# Patient Record
Sex: Male | Born: 1965
Health system: Southern US, Community
[De-identification: ages and names within clinical notes are randomized; demographics above are authoritative.]

## PROBLEM LIST (undated history)

## (undated) DIAGNOSIS — B1920 Unspecified viral hepatitis C without hepatic coma: Secondary | ICD-10-CM

## (undated) DIAGNOSIS — K219 Gastro-esophageal reflux disease without esophagitis: Secondary | ICD-10-CM

## (undated) DIAGNOSIS — A64 Unspecified sexually transmitted disease: Secondary | ICD-10-CM

## (undated) DIAGNOSIS — E785 Hyperlipidemia, unspecified: Secondary | ICD-10-CM

## (undated) DIAGNOSIS — T7840XA Allergy, unspecified, initial encounter: Secondary | ICD-10-CM

## (undated) DIAGNOSIS — H9319 Tinnitus, unspecified ear: Secondary | ICD-10-CM

## (undated) DIAGNOSIS — L409 Psoriasis, unspecified: Secondary | ICD-10-CM

## (undated) HISTORY — DX: Psoriasis, unspecified: L40.9

## (undated) HISTORY — PX: INGUINAL HERNIA REPAIR: SUR1180

## (undated) HISTORY — DX: Unspecified sexually transmitted disease: A64

## (undated) HISTORY — DX: Allergy, unspecified, initial encounter: T78.40XA

## (undated) HISTORY — DX: Hyperlipidemia, unspecified: E78.5

## (undated) HISTORY — PX: JOINT REPLACEMENT: SHX530

## (undated) HISTORY — PX: COLONOSCOPY: SHX174

## (undated) HISTORY — DX: Tinnitus, unspecified ear: H93.19

## (undated) HISTORY — DX: Gastro-esophageal reflux disease without esophagitis: K21.9

---

## 2000-09-24 ENCOUNTER — Emergency Department (HOSPITAL_COMMUNITY): Admission: EM | Admit: 2000-09-24 | Discharge: 2000-09-24 | Payer: Self-pay | Admitting: Emergency Medicine

## 2001-03-02 ENCOUNTER — Emergency Department (HOSPITAL_COMMUNITY): Admission: EM | Admit: 2001-03-02 | Discharge: 2001-03-02 | Payer: Self-pay | Admitting: Emergency Medicine

## 2006-05-12 ENCOUNTER — Emergency Department (HOSPITAL_COMMUNITY): Admission: EM | Admit: 2006-05-12 | Discharge: 2006-05-12 | Payer: Self-pay | Admitting: Emergency Medicine

## 2007-02-16 ENCOUNTER — Emergency Department (HOSPITAL_COMMUNITY): Admission: EM | Admit: 2007-02-16 | Discharge: 2007-02-16 | Payer: Self-pay | Admitting: Emergency Medicine

## 2008-01-07 ENCOUNTER — Ambulatory Visit: Payer: Self-pay | Admitting: Family Medicine

## 2008-01-07 ENCOUNTER — Encounter (INDEPENDENT_AMBULATORY_CARE_PROVIDER_SITE_OTHER): Payer: Self-pay | Admitting: Internal Medicine

## 2008-01-07 LAB — CONVERTED CEMR LAB
ALT: 30 units/L (ref 0–53)
AST: 24 units/L (ref 0–37)
BUN: 22 mg/dL (ref 6–23)
Calcium: 8.8 mg/dL (ref 8.4–10.5)
Chloride: 104 meq/L (ref 96–112)
Creatinine, Ser: 0.85 mg/dL (ref 0.40–1.50)
HCV Ab: REACTIVE — AB
HDL: 54 mg/dL (ref >39)
Hep A Total Ab: NEGATIVE
Hep B Core Total Ab: NEGATIVE
Hep B E Ab: NEGATIVE
Hep B S Ab: NEGATIVE
Hepatitis B Surface Ag: NEGATIVE
Total Bilirubin: 0.5 mg/dL (ref 0.3–1.2)
Total CHOL/HDL Ratio: 2.6
VLDL: 11 mg/dL (ref 0–40)

## 2008-01-08 ENCOUNTER — Ambulatory Visit: Payer: Self-pay | Admitting: *Deleted

## 2008-02-02 ENCOUNTER — Ambulatory Visit: Payer: Self-pay | Admitting: Internal Medicine

## 2008-02-02 LAB — CONVERTED CEMR LAB: HCV Quantitative: 2980000 intl units/mL — ABNORMAL HIGH (ref <43)

## 2009-10-17 ENCOUNTER — Ambulatory Visit: Payer: Self-pay | Admitting: Internal Medicine

## 2009-10-17 ENCOUNTER — Encounter (INDEPENDENT_AMBULATORY_CARE_PROVIDER_SITE_OTHER): Payer: Self-pay | Admitting: Adult Health

## 2009-10-17 LAB — CONVERTED CEMR LAB
Albumin: 4.4 g/dL (ref 3.5–5.2)
BUN: 8 mg/dL (ref 6–23)
CO2: 23 meq/L (ref 19–32)
Calcium: 9.2 mg/dL (ref 8.4–10.5)
Eosinophils Relative: 3 % (ref 0–5)
Glucose, Bld: 95 mg/dL (ref 70–99)
HCT: 46.5 % (ref 39.0–52.0)
Hemoglobin: 15.8 g/dL (ref 13.0–17.0)
Lymphocytes Relative: 42 % (ref 12–46)
MCHC: 34 g/dL (ref 30.0–36.0)
Monocytes Absolute: 0.6 10*3/uL (ref 0.1–1.0)
Monocytes Relative: 9 % (ref 3–12)
Neutro Abs: 3.1 10*3/uL (ref 1.7–7.7)
PSA: 0.64 ng/mL (ref 0.10–4.00)
Potassium: 4.3 meq/L (ref 3.5–5.3)
RBC: 4.92 M/uL (ref 4.22–5.81)
Sodium: 141 meq/L (ref 135–145)
TSH: 1.739 microintl units/mL (ref 0.350–4.500)
Total Protein: 7.2 g/dL (ref 6.0–8.3)
Vit D, 25-Hydroxy: 52 ng/mL (ref 30–89)

## 2009-11-27 ENCOUNTER — Encounter: Payer: Self-pay | Admitting: Cardiology

## 2009-11-27 ENCOUNTER — Ambulatory Visit: Payer: Self-pay | Admitting: Adult Health

## 2009-12-12 ENCOUNTER — Ambulatory Visit (HOSPITAL_COMMUNITY): Admission: RE | Admit: 2009-12-12 | Discharge: 2009-12-12 | Payer: Self-pay | Admitting: Internal Medicine

## 2010-01-03 DIAGNOSIS — R0789 Other chest pain: Secondary | ICD-10-CM | POA: Insufficient documentation

## 2010-01-03 DIAGNOSIS — I4510 Unspecified right bundle-branch block: Secondary | ICD-10-CM | POA: Insufficient documentation

## 2010-01-03 DIAGNOSIS — B1710 Acute hepatitis C without hepatic coma: Secondary | ICD-10-CM | POA: Insufficient documentation

## 2010-02-06 ENCOUNTER — Encounter (INDEPENDENT_AMBULATORY_CARE_PROVIDER_SITE_OTHER): Payer: Self-pay | Admitting: *Deleted

## 2010-03-13 ENCOUNTER — Ambulatory Visit: Payer: Self-pay | Admitting: Cardiology

## 2010-03-13 ENCOUNTER — Encounter: Payer: Self-pay | Admitting: Cardiology

## 2010-03-13 DIAGNOSIS — I498 Other specified cardiac arrhythmias: Secondary | ICD-10-CM | POA: Insufficient documentation

## 2010-05-22 ENCOUNTER — Ambulatory Visit
Admission: RE | Admit: 2010-05-22 | Discharge: 2010-05-22 | Payer: Self-pay | Source: Home / Self Care | Attending: Cardiology | Admitting: Cardiology

## 2010-05-22 ENCOUNTER — Ambulatory Visit: Admission: RE | Admit: 2010-05-22 | Discharge: 2010-05-22 | Payer: Self-pay | Source: Home / Self Care

## 2010-06-19 NOTE — Assessment & Plan Note (Signed)
Summary: np6/ bradycardia rbbb -gd { [facility] pt }   CC:  referal from Dr. Reche Dixon...pt has been having chest pain.  History of Present Illness: 45 year old male for evaluation of bradycardia and chest pain. No prior cardiac history. Echocardiogram in July 2011 her normal LV function and grade 1 diastolic dysfunction. There is trivial mitral regurgitation. There was mild tricuspid regurgitation. TSH in May of 2011 was normal. The patient states that over the past 2 months he has had intermittent chest pain. It is in the left breast area. It is sharp without radiation and there is no associated symptoms. It lasts seconds and resolve spontaneously. It is not pleuritic, positional, related to food or exertional. There is no dyspnea on exertion, orthopnea, PND, palpitations or syncope. Because of the chest pain and history of bradycardia we were asked to further evaluate.  Current Medications (verified): 1)  None  Past History:  Past Medical History: HEPATITIS C   Past Surgical History: Testicular surgery as a child  Family History: Reviewed history and no changes required. Mother with ventricular tachycardia  Social History: Reviewed history from 01/03/2010 and no changes required. Hx of cocaine use and ETOH abuse in the past (sober since 2007) Tobacco Use - Former Full Time Single   Review of Systems       no fevers or chills, productive cough, hemoptysis, dysphasia, odynophagia, melena, hematochezia, dysuria, hematuria, rash, seizure activity, orthopnea, PND, pedal edema, claudication. Remaining systems are negative.   Vital Signs:  Patient profile:   45 year old male Height:      70 inches Weight:      161 pounds BMI:     23.18 Pulse rate:   54 / minute Resp:     14 per minute BP sitting:   115 / 80  (left arm)  Vitals Entered By: Kem Parkinson (March 13, 2010 10:54 AM)  Physical Exam  General:  Well developed/well nourished in NAD Skin warm/dry Patient not  depressed No peripheral clubbing Back-normal HEENT-normal/normal eyelids Neck supple/normal carotid upstroke bilaterally; no bruits; no JVD; no thyromegaly chest - CTA/ normal expansion CV - RRR/normal S1 and S2; no murmurs, rubs or gallops;  PMI nondisplaced Abdomen -NT/ND, no HSM, no mass, + bowel sounds, no bruit 2+ femoral pulses, no bruits Ext-no edema, chords, 2+ DP Neuro-grossly nonfocal     EKG  Procedure date:  03/13/2010  Findings:      Sinus bradycardia at a rate of 54. RV conduction delay.  Impression & Recommendations:  Problem # 1:  CHEST DISCOMFORT (ICD-786.59) Symptoms atypical. Will schedule an exercise treadmill. Orders: Treadmill (Treadmill)  Problem # 2:  BRADYCARDIA (ICD-427.89) No symptoms. No further workup indicated.  Patient Instructions: 1)  Your physician has requested that you have an exercise tolerance test.  For further information please visit [email or web address]  Please also follow instruction sheet, as given.

## 2010-06-19 NOTE — Letter (Signed)
Summary: Appointment - Missed   [facility]  [address]   [place], [place] [address]   Phone: [phone]  Fax: [phone]     February 06, 2010 MRN: [id number]   Keith Armstrong [address] [place], [place]  [address]   Dear Mr. Chirico,  Our records indicate you missed your appointment on 01/08/2010 at 02:30pm with Dr. Jens Som. It is very important that we reach you to reschedule this appointment. We look forward to participating in your health care needs. Please contact us at the number listed above at your earliest convenience to reschedule this appointment.     Sincerely,  Neurosurgeon Team  GD

## 2010-06-19 NOTE — Letter (Signed)
Summary: [facility] Office Note  [facility] Office Note   Imported By: Marylou Mccoy 04/09/2010 08:50:13  _____________________________________________________________________  External Attachment:    Type:   Image     Comment:   External Document

## 2011-03-28 ENCOUNTER — Ambulatory Visit: Payer: Self-pay | Admitting: Gastroenterology

## 2012-07-03 ENCOUNTER — Emergency Department (INDEPENDENT_AMBULATORY_CARE_PROVIDER_SITE_OTHER)
Admission: EM | Admit: 2012-07-03 | Discharge: 2012-07-03 | Disposition: A | Discharge status: Home / Self Care | Payer: Self-pay | Source: Home / Self Care | Attending: Family Medicine | Admitting: Family Medicine

## 2012-07-03 ENCOUNTER — Encounter (HOSPITAL_COMMUNITY): Payer: Self-pay | Admitting: *Deleted

## 2012-07-03 DIAGNOSIS — L259 Unspecified contact dermatitis, unspecified cause: Secondary | ICD-10-CM

## 2012-07-03 MED ORDER — CLOBETASOL PROPIONATE 0.05 % EX CREA: TOPICAL_CREAM | Freq: Two times a day (BID) | CUTANEOUS | Status: DC

## 2012-07-03 NOTE — ED Provider Notes (Signed)
History     CSN: [id number]  Arrival date & time 07/03/12  1156   First MD Initiated Contact with Patient 07/03/12 1405      Chief Complaint  Patient presents with  . Rash    (Consider location/radiation/quality/duration/timing/severity/associated sxs/prior treatment) Patient is a 47 y.o. male presenting with rash. The history is provided by the patient.  Rash Location:  Leg Leg rash location:  L upper leg and R upper leg Quality: itchiness and redness   Quality: not blistering, not bruising, not draining, not painful, not scaling and not weeping   Severity:  Moderate Onset quality:  Gradual Duration:  3 weeks Timing:  Constant Progression:  Spreading Chronicity:  New Context: not medications and not new detergent/soap   Relieved by:  Nothing Ineffective treatments:  Topical steroids Associated symptoms: no fever     History reviewed. No pertinent past medical history.  History reviewed. No pertinent past surgical history.  Family History  Problem Relation Age of Onset  . Diabetes Other   . Heart failure Other     History  Substance Use Topics  . Smoking status: Never Smoker   . Smokeless tobacco: Not on file  . Alcohol Use: No      Review of Systems  Constitutional: Negative for fever and chills.  Skin: Positive for rash.    Allergies  Review of patient's allergies indicates no known allergies.  Home Medications   Current Outpatient Rx  Name  Route  Sig  Dispense  Refill  . clobetasol cream (TEMOVATE) 0.05 %   Topical   Apply topically 2 (two) times daily.   30 g   0     BP 133/83  Pulse 68  Temp(Src) 98.6 F (37 C) (Oral)  Resp 16  SpO2 98%  Physical Exam  Constitutional: He appears well-developed and well-nourished. No distress.  Pulmonary/Chest: Effort normal.  Skin: Skin is warm and dry. Rash noted.     Confluent, patchy, red, slightly raised rash to B inner thighs and R posterior knee.      ED Course  Procedures (including  critical care time)  Labs Reviewed - No data to display No results found.   1. Contact dermatitis       MDM  Sx c/w eczema or contact derm.  Pt has used clobetasol in the past for psoriasis, requests rx to use for this.         Cathlyn Parsons, NP 07/03/12 [phone]

## 2012-07-03 NOTE — ED Provider Notes (Signed)
Medical screening examination/treatment/procedure(s) were performed by resident physician or non-physician practitioner and as supervising physician I was immediately available for consultation/collaboration.   Barkley Bruns MD.   Linna Hoff, MD 07/03/12 [phone]

## 2012-07-03 NOTE — ED Notes (Signed)
Pt reports rash that started on back of right knee three weeks ago, one week ago rash spread to inside of thighs on both legs, itching and burning - little relief from cortisone cream. Pt has changed soaps, detergent with no relief

## 2012-11-21 ENCOUNTER — Encounter (HOSPITAL_COMMUNITY): Payer: Self-pay | Admitting: Emergency Medicine

## 2012-11-21 ENCOUNTER — Emergency Department (HOSPITAL_COMMUNITY)
Admission: EM | Admit: 2012-11-21 | Discharge: 2012-11-21 | Disposition: A | Discharge status: Home / Self Care | Payer: Self-pay | Attending: Emergency Medicine | Admitting: Emergency Medicine

## 2012-11-21 DIAGNOSIS — Z87828 Personal history of other (healed) physical injury and trauma: Secondary | ICD-10-CM | POA: Insufficient documentation

## 2012-11-21 DIAGNOSIS — L97809 Non-pressure chronic ulcer of other part of unspecified lower leg with unspecified severity: Secondary | ICD-10-CM | POA: Insufficient documentation

## 2012-11-21 DIAGNOSIS — Z8619 Personal history of other infectious and parasitic diseases: Secondary | ICD-10-CM | POA: Insufficient documentation

## 2012-11-21 DIAGNOSIS — R21 Rash and other nonspecific skin eruption: Secondary | ICD-10-CM | POA: Insufficient documentation

## 2012-11-21 DIAGNOSIS — L0390 Cellulitis, unspecified: Secondary | ICD-10-CM

## 2012-11-21 DIAGNOSIS — L02419 Cutaneous abscess of limb, unspecified: Secondary | ICD-10-CM | POA: Insufficient documentation

## 2012-11-21 HISTORY — DX: Unspecified viral hepatitis C without hepatic coma: B19.20

## 2012-11-21 MED ORDER — DOXYCYCLINE HYCLATE 100 MG PO TABS
100.0000 mg | ORAL_TABLET | Freq: Once | ORAL | Status: AC
  Administered 2012-11-21: 100 mg via ORAL
  Filled 2012-11-21: qty 1

## 2012-11-21 MED ORDER — DOXYCYCLINE HYCLATE 100 MG PO CAPS: 100.0000 mg | ORAL_CAPSULE | Freq: Two times a day (BID) | ORAL | Status: DC

## 2012-11-21 MED ORDER — BACITRACIN ZINC 500 UNIT/GM EX OINT
TOPICAL_OINTMENT | Freq: Once | CUTANEOUS | Status: AC
  Administered 2012-11-21: 1 via TOPICAL
  Filled 2012-11-21: qty 15

## 2012-11-21 NOTE — ED Notes (Addendum)
PT. REPORTS PROGRESSING RIGHT LOWER LEG ( SHIN ) SKIN INFECTION WITH DRAINAGE FORT SEVERAL WEEKS , PT. STATED IT WAS HIT BY A BASEBALL LAST MONTH. AMBULATORY / DENIES FEVER OR CHILLS.

## 2012-11-21 NOTE — ED Notes (Signed)
Redness to the rt lower leg where he was struck with a softball there one month ago.  He   Thinks the redness is spreading

## 2012-11-21 NOTE — ED Provider Notes (Signed)
   History    CSN: [id number] Arrival date & time 11/21/12  2114  First MD Initiated Contact with Patient 11/21/12 2303     Chief Complaint  Patient presents with  . Cellulitis   (Consider location/radiation/quality/duration/timing/severity/associated sxs/prior Treatment) HPI Pt with history of chronic Hep C reports he was struck in the R shin by a softball about a month ago, he has had a small poorly healing ulcer in that area since then which is not unusual for him. In the last 72 hours though he has noticed increased redness, swelling and tenderness.   Past Medical History  Diagnosis Date  . Hepatitis C    History reviewed. No pertinent past surgical history. Family History  Problem Relation Age of Onset  . Diabetes Other   . Heart failure Other    History  Substance Use Topics  . Smoking status: Never Smoker   . Smokeless tobacco: Not on file  . Alcohol Use: No    Review of Systems All other systems reviewed and are negative except as noted in HPI.   Allergies  Bee venom  Home Medications  No current outpatient prescriptions on file. BP 132/78  Pulse 61  Temp(Src) 98.7 F (37.1 C) (Oral)  Resp 18  SpO2 98% Physical Exam  Nursing note and vitals reviewed. Constitutional: He is oriented to person, place, and time. He appears well-developed and well-nourished.  HENT:  Head: Normocephalic and atraumatic.  Eyes: EOM are normal. Pupils are equal, round, and reactive to light.  Neck: Normal range of motion. Neck supple.  Cardiovascular: Normal rate, normal heart sounds and intact distal pulses.   Pulmonary/Chest: Effort normal and breath sounds normal.  Abdominal: Bowel sounds are normal. He exhibits no distension. There is no tenderness.  Musculoskeletal: Normal range of motion. He exhibits no edema and no tenderness.  Neurological: He is alert and oriented to person, place, and time. He has normal strength. No cranial nerve deficit or sensory deficit.  Skin:  Skin is warm and dry. Rash noted.  Small 1cm ulcer to anterior R lower leg with moderate surrounding erythema, induration and warmth, no abscess, no FB on probing of the wound, does not extend beyond subcutaneous tissues  Psychiatric: He has a normal mood and affect.    ED Course  Procedures (including critical care time) Labs Reviewed - No data to display No results found. 1. Cellulitis     MDM  Superficial cellulitis, local wound care, doycycline and 48hr follow up for recheck.   Charles B. Bernette Mayers, MD 11/21/12 2316

## 2012-11-23 MED FILL — Bacitracin Oint 500 Unit/GM: CUTANEOUS | Qty: 14 | Status: AC

## 2013-02-25 ENCOUNTER — Encounter (HOSPITAL_COMMUNITY): Payer: Self-pay | Admitting: Emergency Medicine

## 2013-02-25 ENCOUNTER — Emergency Department (HOSPITAL_COMMUNITY)
Admission: EM | Admit: 2013-02-25 | Discharge: 2013-02-25 | Disposition: A | Discharge status: Home / Self Care | Payer: Self-pay | Attending: Emergency Medicine | Admitting: Emergency Medicine

## 2013-02-25 DIAGNOSIS — N453 Epididymo-orchitis: Secondary | ICD-10-CM | POA: Insufficient documentation

## 2013-02-25 DIAGNOSIS — B3789 Other sites of candidiasis: Secondary | ICD-10-CM | POA: Insufficient documentation

## 2013-02-25 DIAGNOSIS — Z8619 Personal history of other infectious and parasitic diseases: Secondary | ICD-10-CM | POA: Insufficient documentation

## 2013-02-25 DIAGNOSIS — B379 Candidiasis, unspecified: Secondary | ICD-10-CM

## 2013-02-25 DIAGNOSIS — N451 Epididymitis: Secondary | ICD-10-CM

## 2013-02-25 LAB — URINALYSIS, ROUTINE W REFLEX MICROSCOPIC
Glucose, UA: NEGATIVE mg/dL
Hgb urine dipstick: NEGATIVE
Ketones, ur: NEGATIVE mg/dL
Leukocytes, UA: NEGATIVE
Protein, ur: NEGATIVE mg/dL

## 2013-02-25 MED ORDER — CIPROFLOXACIN HCL 250 MG PO TABS: 250.0000 mg | ORAL_TABLET | Freq: Two times a day (BID) | ORAL | Status: DC

## 2013-02-25 MED ORDER — KETOCONAZOLE-HYDROCORTISONE 2 & 1 % (CREAM) EX KIT: 1.0000 "application " | PACK | Freq: Two times a day (BID) | CUTANEOUS | Status: DC

## 2013-02-25 NOTE — ED Provider Notes (Signed)
CSN: [id number]     Arrival date & time 02/25/13  1033 History   First MD Initiated Contact with Patient 02/25/13 1043     Chief Complaint  Patient presents with  . Groin Swelling   (Consider location/radiation/quality/duration/timing/severity/associated sxs/prior Treatment) HPI Patient is a 47 yo male with a history of hep C, previous drug and alcohol abuse, who presents with right testicular pain and swelling x2 weeks. States he was sitting at his desk 2 weeks ago when he noticed a discomfort in his right testicle. He does not note injury to his testicle. He has not noted lumps or bumps on self exam. Occasionally notes sharp pain shooting up to his right groin. He denies fever, nausea, vomiting, diarrhea, sexual dysfunction, urinary dysfunction, or abnormalities with BMs. Endorses redness on scrotum and in inguinal crease. This has been a chronic issue that comes and goes for many years. Currently has been there for about 3 weeks to a month. He denies sexual activity with others. Last sexual intercourse was years ago per the patient. He states he is a program that abstains from drugs, alcohol, and sexual intercourse.  Past Medical History  Diagnosis Date  . Hepatitis C   . Hepatitis C virus    History reviewed. No pertinent past surgical history. Family History  Problem Relation Age of Onset  . Diabetes Other   . Heart failure Other    History  Substance Use Topics  . Smoking status: Never Smoker   . Smokeless tobacco: Not on file  . Alcohol Use: No    Review of Systems  Constitutional: Negative for fever and chills.  Eyes: Negative for visual disturbance.  Respiratory: Negative for chest tightness and shortness of breath.   Cardiovascular: Negative for chest pain.  Gastrointestinal: Negative for abdominal pain.  Genitourinary: Positive for testicular pain (right). Negative for dysuria and difficulty urinating.  Neurological: Negative for headaches.  All other systems reviewed  and are negative.    Allergies  Bee venom  Home Medications  No current outpatient prescriptions on file. BP 121/88  Pulse 61  Temp(Src) 98 F (36.7 C) (Oral)  Resp 16  Ht 5\' 10"  (1.778 m)  Wt 180 lb (81.647 kg)  BMI 25.83 kg/m2  SpO2 96% Physical Exam  Constitutional: He appears well-developed and well-nourished.  HENT:  Head: Normocephalic and atraumatic.  Mouth/Throat: Oropharynx is clear and moist.  Eyes: Conjunctivae are normal. Pupils are equal, round, and reactive to light.  Cardiovascular: Normal rate, regular rhythm and normal heart sounds.   Pulmonary/Chest: Effort normal and breath sounds normal.  Abdominal: Soft. He exhibits no distension.  Genitourinary:  Scrotum and inguinal crease bilaterally with area of beefy looking erythema with scattered satellite lesions on underside of scrotum Penis with 2 small patches of erythema on the dorsum of the penis Testicles in longitudinal axis. Right epididymis tender to palpation, slightly enlarged. Left testicle normal with palpation. No testicular tenderness bilaterally. No inguinal hernias noted.  Musculoskeletal: He exhibits no edema.  Skin:  Scattered areas of erythema and dryness over elbows and on lower extremities with scaling patches    ED Course  Procedures (including critical care time) Labs Review Labs Reviewed  URINALYSIS, ROUTINE W REFLEX MICROSCOPIC   Imaging Review No results found.  EKG Interpretation   None       MDM   1. Epididymitis, right   2. Candida infection    11:00 am: patient seen and examined. Patient with 2 weeks of testicular pain and swelling.  Is tender in his right epididymis making epididymitis a possible cause. Does not appear to have testicular torsion as testicle is in normal axis. Additionally has erythematous area on scrtoum and inguinal crease that likely represents a candidal intertrigo, though has other skin lesions on elbows and legs that appear psoriatic in nature.  Will check a UA.   12:20 pm: UA returned without signs of UTI. Discussed diagnosis of epididymitis with patient. Patient given prescription for cipro for treatment of this potentially related to E coli infection. Do not feel there is a need to cover for GC/Chlamydia given no sexual activity reported in many years. Also given topical hydrocortisone/ketoconazole cream for erythematous area in groin that is potentially related to candidal infection vs psoriasis. Patient stable for discharge from the ED. Given resource guide for PCP. Given return precautions.   Marikay Alar, MD [facility] PGY-2 02/25/13 12:32 pm  Glori Luis, MD 02/25/13 1236

## 2013-02-25 NOTE — ED Notes (Signed)
Resident at bedside.  

## 2013-02-25 NOTE — ED Notes (Signed)
MD Knapp at bedside 

## 2013-02-25 NOTE — ED Provider Notes (Signed)
I saw and evaluated the patient, reviewed the resident's note and I agree with the findings and plan.  Pt has area of erythematous rash very well demarcated on a portion of his right scrotum and thigh.  ?candida, ?psoriatic plaque.  No ttp in the scrotum or testicle.  No mass.  Mild ttp epididymis.  Not consistent with fourniers or testicular torsion.  Will treat with po meds.  Warning signs discussed.  Follow up with urology  Celene Kras, MD 02/25/13 1245

## 2013-02-25 NOTE — ED Notes (Signed)
Pt states R sided testicular swelling, ongoing x2 weeks. 4/10 pain upon arrival to ED. Denies urinary symptoms. Pt states testicle is warm and painful to touch. Denies any recent trauma. Last BM this morning. NAD.

## 2013-02-25 NOTE — ED Notes (Signed)
Pt with swollen, warm and painful R testicle x 2 weeks.

## 2013-02-26 ENCOUNTER — Telehealth (HOSPITAL_COMMUNITY): Payer: Self-pay | Admitting: Emergency Medicine

## 2013-02-26 NOTE — ED Notes (Signed)
Call from pharmacy regarding clarification of Rx for cream containing Hydrocortisone.  Prescriber Resident E. Sonnenberg MD given phone call for clarification.

## 2013-08-13 ENCOUNTER — Emergency Department (HOSPITAL_COMMUNITY)
Admission: EM | Admit: 2013-08-13 | Discharge: 2013-08-13 | Disposition: A | Discharge status: Home / Self Care | Payer: No Typology Code available for payment source | Source: Home / Self Care | Attending: Family Medicine | Admitting: Family Medicine

## 2013-08-13 ENCOUNTER — Encounter (HOSPITAL_COMMUNITY): Payer: Self-pay | Admitting: Emergency Medicine

## 2013-08-13 DIAGNOSIS — W57XXXA Bitten or stung by nonvenomous insect and other nonvenomous arthropods, initial encounter: Secondary | ICD-10-CM

## 2013-08-13 DIAGNOSIS — L255 Unspecified contact dermatitis due to plants, except food: Secondary | ICD-10-CM

## 2013-08-13 DIAGNOSIS — T148 Other injury of unspecified body region: Secondary | ICD-10-CM

## 2013-08-13 DIAGNOSIS — L237 Allergic contact dermatitis due to plants, except food: Secondary | ICD-10-CM

## 2013-08-13 MED ORDER — CLOBETASOL PROPIONATE 0.05 % EX CREA: 1.0000 "application " | TOPICAL_CREAM | Freq: Two times a day (BID) | CUTANEOUS | Status: DC

## 2013-08-13 MED ORDER — PREDNISONE 10 MG PO KIT: PACK | ORAL | Status: DC

## 2013-08-13 MED ORDER — DOXYCYCLINE HYCLATE 100 MG PO CAPS: 100.0000 mg | ORAL_CAPSULE | Freq: Two times a day (BID) | ORAL | Status: DC

## 2013-08-13 NOTE — ED Notes (Signed)
Pt reports rash/poison ivy bilateral forearm onset 1 week Reports he was working out in the yard Also c/o a tick bite to left shoulder/upper back Denies f/v/n/d Alert w/no signs of acute distress.

## 2013-08-13 NOTE — Discharge Instructions (Signed)
Thank you for coming in today. Take the doxycycline and prednisone as directed Come back as needed

## 2013-08-13 NOTE — ED Provider Notes (Signed)
Keith Armstrong is a 48 y.o. male who presents to Urgent Care today for rash and tick bite. Patient is in one week if poison ivy symptoms. He was exposed and developed linear streaking and vesicles. This is consistent with prior episodes of poison ivy. It is very pruritic. He tried calamine lotion which has not helped.  Additionally he notes a tick bite on his left shoulder. His friend remove the tick about one week ago. He has a erythematous mildly tender nodule on his left upper back/arm. No fevers or chills or sweating body aches or other rash.  Additionally he has psoriasis on his extensor elbows and knees. He was taking clobetasol cream for this he has run out of it like a refill if possible.   Past Medical History  Diagnosis Date  . Hepatitis C   . Hepatitis C virus    History  Substance Use Topics  . Smoking status: Never Smoker   . Smokeless tobacco: Not on file  . Alcohol Use: No   ROS as above Medications: No current facility-administered medications for this encounter.   Current Outpatient Prescriptions  Medication Sig Dispense Refill  . clobetasol cream (TEMOVATE) 6.44 % Apply 1 application topically 2 (two) times daily.  30 g  3  . doxycycline (VIBRAMYCIN) 100 MG capsule Take 1 capsule (100 mg total) by mouth 2 (two) times daily.  20 capsule  0  . PredniSONE 10 MG KIT 12 day dose pack po  1 kit  0    Exam:  BP 111/70  Pulse 56  Temp(Src) 98.1 F (36.7 C) (Oral)  Resp 18  SpO2 97% Gen: Well NAD HEENT: EOMI,  MMM Lungs: Normal work of breathing. CTABL Heart: RRR no MRG Abd: NABS, Soft. NT, ND Exts: Brisk capillary refill, warm and well perfused.  Skin: Erythematous streaking vesicles on both forearms consistent with poison ivy dermatitis. Additionally patient has a 1 cm in diameter nodule with black center on the upper left back/upper arm. Mildly tender with no surrounding erythema or induration. Additionally patient has scaling papules to plaques on bilateral  extensor elbows and knees consistent with psoriasis   Assessment and Plan: 48 y.o. male with  1) poison ivy dermatitis: Prednisone Dosepak 2) tick bite: Doxycycline 3) psoriasis: Refill clobetasol  Encourage followup with primary care provider  Discussed warning signs or symptoms. Please see discharge instructions. Patient expresses understanding.    Gregor Hams, MD 08/13/13 [phone]

## 2013-10-29 ENCOUNTER — Encounter (HOSPITAL_COMMUNITY): Payer: Self-pay | Admitting: Emergency Medicine

## 2013-10-29 ENCOUNTER — Emergency Department (HOSPITAL_COMMUNITY): Payer: No Typology Code available for payment source

## 2013-10-29 ENCOUNTER — Emergency Department (HOSPITAL_COMMUNITY)
Admission: EM | Admit: 2013-10-29 | Discharge: 2013-10-29 | Disposition: A | Discharge status: Home / Self Care | Payer: No Typology Code available for payment source | Attending: Emergency Medicine | Admitting: Emergency Medicine

## 2013-10-29 DIAGNOSIS — Z87891 Personal history of nicotine dependence: Secondary | ICD-10-CM | POA: Insufficient documentation

## 2013-10-29 DIAGNOSIS — Y9389 Activity, other specified: Secondary | ICD-10-CM | POA: Insufficient documentation

## 2013-10-29 DIAGNOSIS — X500XXA Overexertion from strenuous movement or load, initial encounter: Secondary | ICD-10-CM | POA: Insufficient documentation

## 2013-10-29 DIAGNOSIS — Y929 Unspecified place or not applicable: Secondary | ICD-10-CM | POA: Insufficient documentation

## 2013-10-29 DIAGNOSIS — Z8619 Personal history of other infectious and parasitic diseases: Secondary | ICD-10-CM | POA: Insufficient documentation

## 2013-10-29 DIAGNOSIS — S99911A Unspecified injury of right ankle, initial encounter: Secondary | ICD-10-CM

## 2013-10-29 DIAGNOSIS — S99929A Unspecified injury of unspecified foot, initial encounter: Principal | ICD-10-CM

## 2013-10-29 DIAGNOSIS — S8990XA Unspecified injury of unspecified lower leg, initial encounter: Secondary | ICD-10-CM | POA: Insufficient documentation

## 2013-10-29 DIAGNOSIS — S99919A Unspecified injury of unspecified ankle, initial encounter: Secondary | ICD-10-CM | POA: Insufficient documentation

## 2013-10-29 NOTE — ED Notes (Signed)
Transported to xray via wheelchair.

## 2013-10-29 NOTE — ED Provider Notes (Signed)
CSN: [id number]     Arrival date & time 10/29/13  0803 History   First MD Initiated Contact with Patient 10/29/13 0809     Chief Complaint  Patient presents with  . Ankle Pain     (Consider location/radiation/quality/duration/timing/severity/associated sxs/prior Treatment) HPI Comments: Patient is a 48 year old male who presents with right ankle pain since this morning. The mechanism of injury was sudden ankle inversion. Patient reports hearing a "pop" sudden onset of thorbbing, severe pain that is localized to right ankle. Patient reports progressive worsening of pain. Ankle movement and weight bearing activity make the pain worse. Nothing makes the pain better. Patient reports associated swelling. Patient has not tried anything for pain relief. Patient denies obvious deformity, numbness/tingling, coolness/weakness of extremity, bruising, and any other injury.     Patient is a 48 y.o. male presenting with ankle pain.  Ankle Pain Associated symptoms: no fatigue, no fever and no neck pain     Past Medical History  Diagnosis Date  . Hepatitis C   . Hepatitis C virus    No past surgical history on file. Family History  Problem Relation Age of Onset  . Diabetes Other   . Heart failure Other    History  Substance Use Topics  . Smoking status: Former Games developermoker  . Smokeless tobacco: Not on file  . Alcohol Use: No    Review of Systems  Constitutional: Negative for fever, chills and fatigue.  HENT: Negative for trouble swallowing.   Eyes: Negative for visual disturbance.  Respiratory: Negative for shortness of breath.   Cardiovascular: Negative for chest pain and palpitations.  Gastrointestinal: Negative for nausea, vomiting, abdominal pain and diarrhea.  Genitourinary: Negative for dysuria and difficulty urinating.  Musculoskeletal: Positive for arthralgias and joint swelling. Negative for neck pain.  Skin: Negative for color change.  Neurological: Negative for dizziness and  weakness.  Psychiatric/Behavioral: Negative for dysphoric mood.      Allergies  Bee venom  Home Medications   Prior to Admission medications   Medication Sig Start Date End Date Taking? Authorizing Provider  ibuprofen (ADVIL,MOTRIN) 200 MG tablet Take 800 mg by mouth every 6 (six) hours as needed for moderate pain.   Yes Historical Provider, MD   BP 118/75  Pulse 74  Temp(Src) 98.2 F (36.8 C) (Oral)  Resp 14  Ht 5\' 10"  (1.778 m)  Wt 185 lb (83.915 kg)  BMI 26.54 kg/m2  SpO2 97% Physical Exam  Nursing note and vitals reviewed. Constitutional: He is oriented to person, place, and time. He appears well-developed and well-nourished. No distress.  HENT:  Head: Normocephalic and atraumatic.  Eyes: Conjunctivae and EOM are normal.  Neck: Normal range of motion.  Cardiovascular: Normal rate and regular rhythm.  Exam reveals no gallop and no friction rub.   No murmur heard. Pulmonary/Chest: Effort normal and breath sounds normal. He has no wheezes. He has no rales. He exhibits no tenderness.  Musculoskeletal:  Right ankle limited ROM due to pain. Lateral malleolar tenderness to palpation and edema. No obvious deformity. Patient is able to wiggle toes of right foot.   Neurological: He is alert and oriented to person, place, and time.  Speech is goal-oriented. Moves limbs without ataxia.   Skin: Skin is warm and dry.  Psychiatric: He has a normal mood and affect. His behavior is normal.    ED Course  Procedures (including critical care time) Labs Review Labs Reviewed - No data to display  SPLINT APPLICATION Date/Time: 9:03 AM  Authorized by: Emilia BeckKaitlyn Preston Garabedian Consent: Verbal consent obtained. Risks and benefits: risks, benefits and alternatives were discussed Consent given by: patient Splint applied by: orthopedic technician Location details: right ankle Splint type: ASO ankle brace Supplies used: ASO ankle brace Post-procedure: The splinted body part was neurovascularly  unchanged following the procedure. Patient tolerance: Patient tolerated the procedure well with no immediate complications.     Imaging Review Dg Ankle Complete Right  10/29/2013   CLINICAL DATA:  Injury to the right ankle complaining of right ankle pain.  EXAM: RIGHT ANKLE - COMPLETE 3+ VIEW  COMPARISON:  No priors.  FINDINGS: There is no evidence of fracture, dislocation, or joint effusion. There is no evidence of arthropathy or other focal bone abnormality. Os trigonum (normal variant) incidentally noted. Mild soft tissue swelling anterior to the tibiotalar joint.  IMPRESSION: No radiographic evidence of significant acute traumatic injury to the bones of the right ankle.   Electronically Signed   By: Trudie Reedaniel  Entrikin M.D.   On: 10/29/2013 08:47     EKG Interpretation None      MDM   Final diagnoses:  Right ankle injury    8:55 AM Xray unremarkable for acute changes. Patient will have ASO brace for support. Patient denies pain medication. No neurovascular compromise. No other injuries.    Emilia BeckKaitlyn Kipp Shank, [place] JerseyPA-C 10/29/13 [phone]

## 2013-10-29 NOTE — ED Notes (Signed)
States he heard a pop in his right ankle when he "rolled his ankle".Postive right pedal pulse able to move all toes. Swelling to top of his foot and swelling to lateral aspect of his ankle.

## 2013-10-29 NOTE — ED Provider Notes (Signed)
Medical screening examination/treatment/procedure(s) were performed by non-physician practitioner and as supervising physician I was immediately available for consultation/collaboration.   EKG Interpretation None        Darlynn Ricco N Safal Halderman, DO 10/29/13 1324 

## 2013-10-29 NOTE — Progress Notes (Signed)
Orthopedic Tech Progress Note Patient Details:  Keith Armstrong Didion [birth year] [id number]  Ortho Devices Type of Ortho Device: ASO Ortho Device/Splint Location: RLE Ortho Device/Splint Interventions: Application   [name] 10/29/2013, 9:27 AM

## 2013-10-29 NOTE — ED Notes (Signed)
  States he was walking down steps and rolled his right ankle

## 2013-10-29 NOTE — Discharge Instructions (Signed)
Wear ankle brace as needed for support. Rest, ice and elevate your ankle. Take tylenol or ibuprofen as needed for pain.

## 2013-11-01 ENCOUNTER — Ambulatory Visit: Payer: No Typology Code available for payment source | Attending: Internal Medicine | Admitting: Internal Medicine

## 2013-11-01 ENCOUNTER — Encounter: Payer: Self-pay | Admitting: Internal Medicine

## 2013-11-01 VITALS — BP 125/82 | HR 62 | Temp 98.7°F | Resp 16 | Wt 186.6 lb

## 2013-11-01 DIAGNOSIS — Z87891 Personal history of nicotine dependence: Secondary | ICD-10-CM | POA: Insufficient documentation

## 2013-11-01 DIAGNOSIS — H6120 Impacted cerumen, unspecified ear: Secondary | ICD-10-CM | POA: Insufficient documentation

## 2013-11-01 DIAGNOSIS — Z139 Encounter for screening, unspecified: Secondary | ICD-10-CM

## 2013-11-01 DIAGNOSIS — M25571 Pain in right ankle and joints of right foot: Secondary | ICD-10-CM

## 2013-11-01 DIAGNOSIS — B1710 Acute hepatitis C without hepatic coma: Secondary | ICD-10-CM

## 2013-11-01 DIAGNOSIS — M25579 Pain in unspecified ankle and joints of unspecified foot: Secondary | ICD-10-CM

## 2013-11-01 DIAGNOSIS — B1920 Unspecified viral hepatitis C without hepatic coma: Secondary | ICD-10-CM | POA: Insufficient documentation

## 2013-11-01 LAB — COMPLETE METABOLIC PANEL WITH GFR
ALBUMIN: 4.4 g/dL (ref 3.5–5.2)
ALT: 38 U/L (ref 0–53)
AST: 29 U/L (ref 0–37)
Alkaline Phosphatase: 51 U/L (ref 39–117)
BILIRUBIN TOTAL: 0.8 mg/dL (ref 0.2–1.2)
BUN: 8 mg/dL (ref 6–23)
CHLORIDE: 104 meq/L (ref 96–112)
CO2: 28 meq/L (ref 19–32)
Calcium: 9.5 mg/dL (ref 8.4–10.5)
Creat: 0.83 mg/dL (ref 0.50–1.35)
GLUCOSE: 102 mg/dL — AB (ref 70–99)
POTASSIUM: 4.6 meq/L (ref 3.5–5.3)
SODIUM: 140 meq/L (ref 135–145)
TOTAL PROTEIN: 6.9 g/dL (ref 6.0–8.3)

## 2013-11-01 LAB — CBC WITH DIFFERENTIAL/PLATELET
Basophils Absolute: 0.1 10*3/uL (ref 0.0–0.1)
Basophils Relative: 1 % (ref 0–1)
EOS ABS: 0.1 10*3/uL (ref 0.0–0.7)
Eosinophils Relative: 2 % (ref 0–5)
HCT: 44.7 % (ref 39.0–52.0)
HEMOGLOBIN: 15.5 g/dL (ref 13.0–17.0)
LYMPHS ABS: 2.2 10*3/uL (ref 0.7–4.0)
LYMPHS PCT: 35 % (ref 12–46)
MCH: 31 pg (ref 26.0–34.0)
MCHC: 34.7 g/dL (ref 30.0–36.0)
MCV: 89.4 fL (ref 78.0–100.0)
MONOS PCT: 9 % (ref 3–12)
Monocytes Absolute: 0.6 10*3/uL (ref 0.1–1.0)
NEUTROS ABS: 3.3 10*3/uL (ref 1.7–7.7)
NEUTROS PCT: 53 % (ref 43–77)
PLATELETS: 320 10*3/uL (ref 150–400)
RBC: 5 MIL/uL (ref 4.22–5.81)
RDW: 13.7 % (ref 11.5–15.5)
WBC: 6.3 10*3/uL (ref 4.0–10.5)

## 2013-11-01 LAB — LIPID PANEL
Cholesterol: 164 mg/dL (ref 0–200)
HDL: 51 mg/dL (ref >39)
LDL CALC: 101 mg/dL — AB (ref 0–99)
TRIGLYCERIDES: 58 mg/dL (ref <150)
Total CHOL/HDL Ratio: 3.2 Ratio
VLDL: 12 mg/dL (ref 0–40)

## 2013-11-01 LAB — TSH: TSH: 1.519 u[IU]/mL (ref 0.350–4.500)

## 2013-11-01 MED ORDER — CARBAMIDE PEROXIDE 6.5 % OT SOLN: 5.0000 [drp] | Freq: Two times a day (BID) | OTIC | Status: DC

## 2013-11-01 MED ORDER — IBUPROFEN 800 MG PO TABS: 800.0000 mg | ORAL_TABLET | Freq: Three times a day (TID) | ORAL | Status: DC | PRN

## 2013-11-01 NOTE — Progress Notes (Signed)
Patient Demographics  Keith Armstrong, is a 48 y.o. male  [id number]  [id number]  DOB - 25-Sep-1965  CC:  Chief Complaint  Patient presents with  . Establish Care       HPI: Keith Armstrong is a 48 y.o. male here today to establish medical care. Patient has history of hepatitis C as per patient he never was treated, he also recently went to the emergency room with right ankle pain, EMR reviewed patient had an x-ray done which was negative was prescribed ankle brace which patient wears at home and that helps with the symptoms, he took ibuprofen over the counter which also helps him with the pain and swelling, denies any fever chills chest and shortness of breath change in bowel habits. Patient reported to have some ringing sensation in the ears. Patient has No headache, No chest pain, No abdominal pain - No Nausea, No new weakness tingling or numbness, No Cough - SOB.  Allergies  Allergen Reactions  . Bee Venom Anaphylaxis   Past Medical History  Diagnosis Date  . Hepatitis C   . Hepatitis C virus    No current outpatient prescriptions on file prior to visit.   No current facility-administered medications on file prior to visit.   Family History  Problem Relation Age of Onset  . Diabetes Other   . Heart failure Other   . Hypertension Mother   . Cancer Mother     lung cancer  . Heart disease Maternal Grandmother    History   Social History  . Marital Status: Single    Spouse Name: N/A    Number of Children: N/A  . Years of Education: N/A   Occupational History  . Not on file.   Social History Main Topics  . Smoking status: Former Smoker -- 1.00 packs/day for 20 years  . Smokeless tobacco: Not on file  . Alcohol Use: No  . Drug Use: No     Comment: last use was 03/2006  . Sexual Activity: No   Other Topics Concern  . Not on file   Social History Narrative  . No narrative on file    Review of Systems: Constitutional: Negative for fever,  chills, diaphoresis, activity change, appetite change and fatigue. HENT: Negative for ear pain, nosebleeds, congestion, facial swelling, rhinorrhea, neck pain, neck stiffness and ear discharge.  Eyes: Negative for pain, discharge, redness, itching and visual disturbance. Respiratory: Negative for cough, choking, chest tightness, shortness of breath, wheezing and stridor.  Cardiovascular: Negative for chest pain, palpitations and leg swelling. Gastrointestinal: Negative for abdominal distention. Genitourinary: Negative for dysuria, urgency, frequency, hematuria, flank pain, decreased urine volume, difficulty urinating and dyspareunia.  Musculoskeletal: Negative for back pain, joint swelling, arthralgia and gait problem. Neurological: Negative for dizziness, tremors, seizures, syncope, facial asymmetry, speech difficulty, weakness, light-headedness, numbness and headaches.  Hematological: Negative for adenopathy. Does not bruise/bleed easily. Psychiatric/Behavioral: Negative for hallucinations, behavioral problems, confusion, dysphoric mood, decreased concentration and agitation.    Objective:   Filed Vitals:   11/01/13 1157  BP: 125/82  Pulse: 62  Temp: 98.7 F (37.1 C)  Resp: 16    Physical Exam: Constitutional: Patient appears well-developed and well-nourished. No distress. HENT: Normocephalic, atraumatic, External right and left ear normal. Oropharynx is clear and moist. Increased wax in both ears.  Eyes: Conjunctivae and EOM are normal. PERRLA, no scleral icterus. Neck: Normal ROM. Neck supple. No JVD. No tracheal deviation. No thyromegaly. CVS: RRR, S1/S2 +, no murmurs, no gallops,  no carotid bruit.  Pulmonary: Effort and breath sounds normal, no stridor, rhonchi, wheezes, rales.  Abdominal: Soft. BS +, no distension, tenderness, rebound or guarding.  Musculoskeletal: Normal range of motion. No edema and no tenderness. Right ankle some swelling, bruise, and tenderness  Neuro:  Alert. Normal reflexes, muscle tone coordination. No cranial nerve deficit. Skin: Skin is warm and dry. No rash noted. Not diaphoretic. No erythema. No pallor. Psychiatric: Normal mood and affect. Behavior, judgment, thought content normal.  Lab Results  Component Value Date   WBC 6.7 10/17/2009   HGB 15.8 10/17/2009   HCT 46.5 10/17/2009   MCV 94.5 10/17/2009   PLT 297 10/17/2009   Lab Results  Component Value Date   CREATININE 0.75 10/17/2009   BUN 8 10/17/2009   NA 141 10/17/2009   K 4.3 10/17/2009   CL 104 10/17/2009   CO2 23 10/17/2009    No results found for this basename: HGBA1C   Lipid Panel     Component Value Date/Time   CHOL 145 11/27/2009 2356   TRIG 49 11/27/2009 2356   HDL 55 11/27/2009 2356   CHOLHDL 2.6 Ratio 11/27/2009 2356   VLDL 10 11/27/2009 2356   LDLCALC 80 11/27/2009 2356       Assessment and plan:   1. Screening Ordered baseline blood work. - CBC with Differential - COMPLETE METABOLIC PANEL WITH GFR - TSH - Lipid panel - Vit D  25 hydroxy (rtn osteoporosis monitoring)  2. HEPATITIS C  - Ambulatory referral to Infectious Disease  3. Right ankle pain Continue with ankle brace, I prescribed ibuprofen 800 mg every 8 when necessary for pain - ibuprofen (ADVIL,MOTRIN) 800 MG tablet; Take 1 tablet (800 mg total) by mouth every 8 (eight) hours as needed.  Dispense: 30 tablet; Refill: 1  4. Excess ear wax  - carbamide peroxide (DEBROX) 6.5 % otic solution; Place 5 drops into both ears 2 (two) times daily.  Dispense: 15 mL; Refill: 1   Return in about 3 months (around 02/01/2014) for Hep C.   Doris CheadleADVANI, Keith Gantt, MD

## 2013-11-01 NOTE — Progress Notes (Signed)
Patient here to establish care Complains of ringing in both ears Fell down the steps this past Friday and injured his Right foot X ray done at [facility] was negative

## 2013-11-02 ENCOUNTER — Telehealth: Payer: Self-pay

## 2013-11-02 LAB — VITAMIN D 25 HYDROXY (VIT D DEFICIENCY, FRACTURES): VIT D 25 HYDROXY: 53 ng/mL (ref 30–89)

## 2013-11-02 NOTE — Telephone Encounter (Signed)
Message copied by Lestine MountJUAREZ, Shanedra Lave L on Tue Nov 02, 2013 12:31 PM ------      Message from: Doris CheadleADVANI, DEEPAK      Created: Tue Nov 02, 2013  9:29 AM       Blood work reviewed noticed impaired fasting glucose, call and advise patient for low carbohydrate diet.       ------

## 2013-11-02 NOTE — Telephone Encounter (Signed)
Spoke with patient and he is aware of his lab results 

## 2013-11-10 ENCOUNTER — Other Ambulatory Visit (INDEPENDENT_AMBULATORY_CARE_PROVIDER_SITE_OTHER): Payer: No Typology Code available for payment source

## 2013-11-10 ENCOUNTER — Other Ambulatory Visit: Payer: Self-pay | Admitting: Internal Medicine

## 2013-11-10 DIAGNOSIS — B182 Chronic viral hepatitis C: Secondary | ICD-10-CM

## 2013-11-10 LAB — IRON: IRON: 104 ug/dL (ref 42–165)

## 2013-11-11 LAB — HEPATITIS B SURFACE ANTIBODY,QUALITATIVE: HEP B S AB: NEGATIVE

## 2013-11-11 LAB — ANA: ANA: NEGATIVE

## 2013-11-11 LAB — HEPATITIS B CORE ANTIBODY, TOTAL: HEP B C TOTAL AB: NONREACTIVE

## 2013-11-11 LAB — HIV ANTIBODY (ROUTINE TESTING W REFLEX): HIV: NONREACTIVE

## 2013-11-11 LAB — HEPATITIS A ANTIBODY, TOTAL: Hep A Total Ab: NONREACTIVE

## 2013-11-11 LAB — PROTIME-INR
INR: 0.95 (ref <1.50)
Prothrombin Time: 12.7 seconds (ref 11.6–15.2)

## 2013-11-11 LAB — HEPATITIS B SURFACE ANTIGEN: Hepatitis B Surface Ag: NEGATIVE

## 2013-11-15 LAB — HEPATITIS C RNA QUANTITATIVE
HCV QUANT: 656914 [IU]/mL — AB (ref <15)
HCV Quantitative Log: 5.82 {Log} — ABNORMAL HIGH (ref <1.18)

## 2013-11-18 LAB — HEPATITIS C GENOTYPE: HCV Genotype: 3

## 2013-12-14 ENCOUNTER — Encounter: Payer: Self-pay | Admitting: Internal Medicine

## 2013-12-14 ENCOUNTER — Ambulatory Visit (INDEPENDENT_AMBULATORY_CARE_PROVIDER_SITE_OTHER): Payer: No Typology Code available for payment source | Admitting: Internal Medicine

## 2013-12-14 VITALS — BP 117/73 | HR 65 | Temp 97.3°F | Wt 187.0 lb

## 2013-12-14 DIAGNOSIS — Z23 Encounter for immunization: Secondary | ICD-10-CM

## 2013-12-14 DIAGNOSIS — B182 Chronic viral hepatitis C: Secondary | ICD-10-CM

## 2013-12-14 NOTE — Progress Notes (Signed)
Patient ID: Keith Armstrong, male   DOB: [birth year], 48 y.o.   MRN: [id number]  RFV: chronic hepatitis C  Keith Armstrong is a 48 y.o. male who presents for evaluation and management of chronic hepatitis C first diagnosed in 2006, found out through donating plasma . Hepatitis C risk factors present are: he previously alcohol abuse, only once IVDU in 1989 and often shared pipes,  sober since 2007. he works at [employer]. Patient denies any recent drug use, not in a relationship with anyone that has hepatitis C. Not known to have a sex partner with Hep C. Patient has not other studies performed. Results: hepatitis C genotype 3, and VL. Patient not had prior treatment for Hepatitis C. Patient does not  have a past history of liver disease. Patient does not have a family history of liver disease.    Patient does not have documented immunity to Hepatitis A. Patient does not have documented immunity to Hepatitis B.     Review of Systems Constitutional: Negative for fever, chills, diaphoresis, activity change, appetite change, fatigue and unexpected weight change.  HENT: Negative for congestion, sore throat, rhinorrhea, sneezing, trouble swallowing and sinus pressure.  Eyes: Negative for photophobia and visual disturbance.  Respiratory: Negative for cough, chest tightness, shortness of breath, wheezing and stridor.  Cardiovascular: Negative for chest pain, palpitations and leg swelling.  Gastrointestinal: Negative for nausea, vomiting, abdominal pain, diarrhea, constipation, blood in stool, abdominal distention and anal bleeding.  Genitourinary: Negative for dysuria, hematuria, flank pain and difficulty urinating.  Musculoskeletal: Negative for myalgias, back pain, joint swelling, arthralgias and gait problem.  Skin: Negative for color change, pallor, rash and wound.  Neurological: Negative for dizziness, tremors, weakness and light-headedness.  Hematological: Negative for  adenopathy. Does not bruise/bleed easily.  Psychiatric/Behavioral: Negative for behavioral problems, confusion, sleep disturbance, dysphoric mood, decreased concentration and agitation.   Current Outpatient Prescriptions on File Prior to Visit  Medication Sig Dispense Refill  . ibuprofen (ADVIL,MOTRIN) 800 MG tablet Take 1 tablet (800 mg total) by mouth every 8 (eight) hours as needed.  30 tablet  1  . carbamide peroxide (DEBROX) 6.5 % otic solution Place 5 drops into both ears 2 (two) times daily.  15 mL  1   No current facility-administered medications on file prior to visit.     Past Medical History  Diagnosis Date  . Hepatitis C   . Hepatitis C virus     History  Substance Use Topics  . Smoking status: Former Smoker -- 1.00 packs/day for 20 years  . Smokeless tobacco: Not on file  . Alcohol Use: No    Family History  Problem Relation Age of Onset  . Diabetes Other   . Heart failure Other   . Hypertension Mother   . Cancer Mother     lung cancer  . Heart disease Maternal Grandmother       Objective:   Filed Vitals:   12/14/13 1406  BP: 117/73  Pulse: 65  Temp: 97.3 F (36.3 C)   Constitutional: He is oriented to person, place, and time. He appears well-developed and well-nourished. No distress.  HENT:  Mouth/Throat: Oropharynx is clear and moist. No oropharyngeal exudate.  Cardiovascular: Normal rate, regular rhythm and normal heart sounds. Exam reveals no gallop and no friction rub.  No murmur heard.  Pulmonary/Chest: Effort normal and breath sounds normal. No respiratory distress. He has no wheezes.  Abdominal: Soft. Bowel sounds are normal. He exhibits no distension. There  is no tenderness.  Lymphadenopathy:  He has no cervical adenopathy.  Neurological: He is alert and oriented to person, place, and time.  Skin: Skin is warm and dry. No rash noted. No erythema.  Psychiatric: He has a normal mood and affect. His behavior is normal.     Laboratory Genotype:  Lab Results  Component Value Date   HCVGENOTYPE 3 11/10/2013    Lab Results  Component Value Date   WBC 6.3 11/01/2013   HGB 15.5 11/01/2013   HCT 44.7 11/01/2013   MCV 89.4 11/01/2013   PLT 320 11/01/2013    Lab Results  Component Value Date   CREATININE 0.83 11/01/2013   BUN 8 11/01/2013   NA 140 11/01/2013   K 4.6 11/01/2013   CL 104 11/01/2013   CO2 28 11/01/2013    Lab Results  Component Value Date   ALT 38 11/01/2013   AST 29 11/01/2013   ALKPHOS 51 11/01/2013   BILITOT 0.8 11/01/2013    Hepatic Function Latest Ref Rng 11/01/2013 10/17/2009 01/07/2008  Total Protein 6.0 - 8.3 g/dL 6.9 7.2 7.0  Albumin 3.5 - 5.2 g/dL 4.4 4.4 4.4  AST 0 - 37 U/L $Remo'29 21 24  'svOPx$ ALT 0 - 53 U/L 38 28 30  Alk Phosphatase 39 - 117 U/L 51 55 58  Total Bilirubin 0.2 - 1.2 mg/dL 0.8 0.5 0.5   Hepatitis C RNA quantitative Latest Ref Rng 11/10/2013 02/02/2008  HCV Quantitative <15 IU/mL 656914(H) 2980000(H)  HCV Quantitative Log <1.18 log 10 5.82(H) -   Lab Results  Component Value Date   HCVGENOTYPE 3 11/10/2013    Radiology No components found with this basename: ULTRASOUNDABDOMEN, ULTRASOUNDHEPATICELASTOGRAPHY    Assessment: Chronic hepatitis genotype 3  Plan: 1) Patient counseled extensively on limiting acetaminophen to no more than 2 grams daily, avoidance of alcohol. 2) Transmission discussed with patient including sexual transmission, sharing razors and toothbrush.   3) Will need referral to gastroenterology: no 4) Will need referral for substance abuse counseling: no, currently in a program 5) Will follow up in 6 month to see if he can do ultrasound. No health insurance at this time.

## 2013-12-15 ENCOUNTER — Other Ambulatory Visit: Payer: Self-pay | Admitting: Internal Medicine

## 2014-01-13 ENCOUNTER — Ambulatory Visit (INDEPENDENT_AMBULATORY_CARE_PROVIDER_SITE_OTHER): Payer: No Typology Code available for payment source | Admitting: *Deleted

## 2014-01-13 ENCOUNTER — Encounter: Payer: Self-pay | Admitting: Internal Medicine

## 2014-01-13 DIAGNOSIS — Z23 Encounter for immunization: Secondary | ICD-10-CM

## 2014-02-02 ENCOUNTER — Ambulatory Visit: Payer: No Typology Code available for payment source | Attending: Internal Medicine | Admitting: Internal Medicine

## 2014-02-02 ENCOUNTER — Encounter: Payer: Self-pay | Admitting: Internal Medicine

## 2014-02-02 VITALS — BP 124/83 | HR 60 | Temp 98.2°F | Resp 16 | Wt 187.0 lb

## 2014-02-02 DIAGNOSIS — Z87891 Personal history of nicotine dependence: Secondary | ICD-10-CM | POA: Insufficient documentation

## 2014-02-02 DIAGNOSIS — H6123 Impacted cerumen, bilateral: Secondary | ICD-10-CM

## 2014-02-02 DIAGNOSIS — Z8619 Personal history of other infectious and parasitic diseases: Secondary | ICD-10-CM

## 2014-02-02 DIAGNOSIS — L408 Other psoriasis: Secondary | ICD-10-CM | POA: Insufficient documentation

## 2014-02-02 DIAGNOSIS — H6120 Impacted cerumen, unspecified ear: Secondary | ICD-10-CM | POA: Insufficient documentation

## 2014-02-02 MED ORDER — CLOBETASOL PROP EMOLLIENT BASE 0.05 % EX CREA: TOPICAL_CREAM | CUTANEOUS | Status: DC

## 2014-02-02 NOTE — Progress Notes (Signed)
MRN: [id number] Name: Keith Armstrong  Sex: male Age: 48 y.o. DOB: May 05, 1966  Allergies: Bee venom  Chief Complaint  Patient presents with  . Follow-up    HPI: Patient is 48 y.o. male who has history of hepatitis C currently following her with ID as per patient he already got his flu shot and hepatitis B vaccination, he still complains of some hearing reduced and ringing in the ears, he has tried Debrox ear drops, denies any fever chills stuffy nose sore throat chest pain or shortness of breath. Patient also history of psoriasis and is requesting refill on clobetasol cream.  Past Medical History  Diagnosis Date  . Hepatitis C   . Hepatitis C virus     History reviewed. No pertinent past surgical history.    Medication List       This list is accurate as of: 02/02/14 10:21 AM.  Always use your most recent med list.               carbamide peroxide 6.5 % otic solution  Commonly known as:  DEBROX  Place 5 drops into both ears 2 (two) times daily.     Clobetasol Prop Emollient Base 0.05 % emollient cream  Use to affected areas twice daily and PRN     ibuprofen 800 MG tablet  Commonly known as:  ADVIL,MOTRIN  Take 1 tablet (800 mg total) by mouth every 8 (eight) hours as needed.        Meds ordered this encounter  Medications  . Clobetasol Prop Emollient Base 0.05 % emollient cream    Sig: Use to affected areas twice daily and PRN    Dispense:  30 g    Refill:  0    Immunization History  Administered Date(s) Administered  . Hepatitis A, Adult 12/14/2013  . Hepatitis B, adult/adol-2 dose 12/14/2013, 01/13/2014  . Influenza,inj,Quad PF,36+ Mos 01/13/2014    Family History  Problem Relation Age of Onset  . Diabetes Other   . Heart failure Other   . Hypertension Mother   . Cancer Mother     lung cancer  . Heart disease Maternal Grandmother     History  Substance Use Topics  . Smoking status: Former Smoker -- 1.00 packs/day for 20 years  .  Smokeless tobacco: Not on file  . Alcohol Use: No    Review of Systems   As noted in HPI  Filed Vitals:   02/02/14 0935  BP: 124/83  Pulse: 60  Temp: 98.2 F (36.8 C)  Resp: 16    Physical Exam  Physical Exam  HENT:  Increased wax in both ears L>R  Eyes: EOM are normal. Pupils are equal, round, and reactive to light.  Cardiovascular: Normal rate and regular rhythm.   Pulmonary/Chest: Breath sounds normal. No respiratory distress. He has no wheezes. He has no rales.  Abdominal: Soft. There is no tenderness. There is no rebound.  Skin:  Psoriatic  rash on elbows    CBC    Component Value Date/Time   WBC 6.3 11/01/2013 1227   RBC 5.00 11/01/2013 1227   HGB 15.5 11/01/2013 1227   HCT 44.7 11/01/2013 1227   PLT 320 11/01/2013 1227   MCV 89.4 11/01/2013 1227   LYMPHSABS 2.2 11/01/2013 1227   MONOABS 0.6 11/01/2013 1227   EOSABS 0.1 11/01/2013 1227   BASOSABS 0.1 11/01/2013 1227    CMP     Component Value Date/Time   NA 140 11/01/2013 1227  K 4.6 11/01/2013 1227   CL 104 11/01/2013 1227   CO2 28 11/01/2013 1227   GLUCOSE 102* 11/01/2013 1227   BUN 8 11/01/2013 1227   CREATININE 0.83 11/01/2013 1227   CREATININE 0.75 10/17/2009 2313   CALCIUM 9.5 11/01/2013 1227   PROT 6.9 11/01/2013 1227   ALBUMIN 4.4 11/01/2013 1227   AST 29 11/01/2013 1227   ALT 38 11/01/2013 1227   ALKPHOS 51 11/01/2013 1227   BILITOT 0.8 11/01/2013 1227   GFRNONAA >89 11/01/2013 1227   GFRAA >89 11/01/2013 1227    Lab Results  Component Value Date/Time   CHOL 164 11/01/2013 12:27 PM    No components found with this basename: hga1c    Lab Results  Component Value Date/Time   AST 29 11/01/2013 12:27 PM    Assessment and Plan  Excess ear wax, bilateral - Plan: Ear wax removal was done since patient complain of tinnitus and hearing reduced is given referral to the ENT for further evaluation  History of hepatitis C Currently patient following up with infectious disease.  Health  Maintenance -Colonoscopy: -  -Influenza up-to-date  Return in about 1 year (around 02/03/2015), or if symptoms worsen or fail to improve.  Doris Cheadle, MD

## 2014-02-02 NOTE — Progress Notes (Signed)
Patient here for follow up Patient still complains of having some ringing in his ears Has been using the drops prescribed but not getting any better

## 2014-02-08 ENCOUNTER — Encounter: Payer: Self-pay | Admitting: Internal Medicine

## 2014-06-15 ENCOUNTER — Ambulatory Visit: Payer: No Typology Code available for payment source | Attending: Internal Medicine | Admitting: Internal Medicine

## 2014-06-15 ENCOUNTER — Ambulatory Visit (INDEPENDENT_AMBULATORY_CARE_PROVIDER_SITE_OTHER): Payer: No Typology Code available for payment source | Admitting: *Deleted

## 2014-06-15 ENCOUNTER — Encounter: Payer: Self-pay | Admitting: Internal Medicine

## 2014-06-15 VITALS — BP 122/87 | HR 98 | Temp 97.9°F | Resp 16 | Wt 183.6 lb

## 2014-06-15 DIAGNOSIS — N451 Epididymitis: Secondary | ICD-10-CM | POA: Insufficient documentation

## 2014-06-15 DIAGNOSIS — T63441A Toxic effect of venom of bees, accidental (unintentional), initial encounter: Secondary | ICD-10-CM | POA: Insufficient documentation

## 2014-06-15 DIAGNOSIS — Z23 Encounter for immunization: Secondary | ICD-10-CM

## 2014-06-15 DIAGNOSIS — L409 Psoriasis, unspecified: Secondary | ICD-10-CM

## 2014-06-15 DIAGNOSIS — B20 Human immunodeficiency virus [HIV] disease: Secondary | ICD-10-CM

## 2014-06-15 DIAGNOSIS — B356 Tinea cruris: Secondary | ICD-10-CM

## 2014-06-15 DIAGNOSIS — R21 Rash and other nonspecific skin eruption: Secondary | ICD-10-CM | POA: Insufficient documentation

## 2014-06-15 DIAGNOSIS — N50811 Right testicular pain: Secondary | ICD-10-CM

## 2014-06-15 DIAGNOSIS — B353 Tinea pedis: Secondary | ICD-10-CM | POA: Insufficient documentation

## 2014-06-15 DIAGNOSIS — N508 Other specified disorders of male genital organs: Secondary | ICD-10-CM

## 2014-06-15 MED ORDER — AMOXICILLIN-POT CLAVULANATE 875-125 MG PO TABS: 1.0000 | ORAL_TABLET | Freq: Two times a day (BID) | ORAL | Status: DC

## 2014-06-15 MED ORDER — CLOTRIMAZOLE-BETAMETHASONE 1-0.05 % EX CREA: 1.0000 "application " | TOPICAL_CREAM | Freq: Two times a day (BID) | CUTANEOUS | Status: DC

## 2014-06-15 NOTE — Progress Notes (Signed)
Patient complains of dry patches of skin to his arms Having some dry skin around his groin area and concerned his right Testicle is swollen Requesting a referral to dermatology

## 2014-06-15 NOTE — Progress Notes (Signed)
MRN: [id number] Name: Keith Armstrong  Sex: male Age: 49 y.o. DOB: 09-Aug-1965  Allergies: Bee venom  Chief Complaint  Patient presents with  . Rash    HPI: Patient is 49 y.o. male who has history of psoriasis mainly on elbows for several years currently applying clobetasol, he reported to have inguinal or itchy rash for several weeks now, has not applied any creams he does report some the skin and does report family history of skin cancers, he has not seen any dermatologist and is requesting referral, he has also been diagnosed with epididymitis in the past, recently have been having similar symptoms and also patient is concerned sometimes he has testicular pain and wanted to get ultrasound done, denies any urinary symptoms.  Past Medical History  Diagnosis Date  . Hepatitis C   . Hepatitis C virus     History reviewed. No pertinent past surgical history.    Medication List       This list is accurate as of: 06/15/14  5:47 PM.  Always use your most recent med list.               amoxicillin-clavulanate 875-125 MG per tablet  Commonly known as:  AUGMENTIN  Take 1 tablet by mouth 2 (two) times daily.     carbamide peroxide 6.5 % otic solution  Commonly known as:  DEBROX  Place 5 drops into both ears 2 (two) times daily.     Clobetasol Prop Emollient Base 0.05 % emollient cream  Use to affected areas twice daily and PRN     clotrimazole-betamethasone cream  Commonly known as:  LOTRISONE  Apply 1 application topically 2 (two) times daily.     ibuprofen 800 MG tablet  Commonly known as:  ADVIL,MOTRIN  Take 1 tablet (800 mg total) by mouth every 8 (eight) hours as needed.        Meds ordered this encounter  Medications  . clotrimazole-betamethasone (LOTRISONE) cream    Sig: Apply 1 application topically 2 (two) times daily.    Dispense:  30 g    Refill:  1  . amoxicillin-clavulanate (AUGMENTIN) 875-125 MG per tablet    Sig: Take 1 tablet by mouth 2 (two)  times daily.    Dispense:  20 tablet    Refill:  0    Immunization History  Administered Date(s) Administered  . Hepatitis A, Adult 12/14/2013  . Hepatitis B, adult/adol-2 dose 12/14/2013, 01/13/2014, 06/15/2014  . Influenza,inj,Quad PF,36+ Mos 01/13/2014    Family History  Problem Relation Age of Onset  . Diabetes Other   . Heart failure Other   . Hypertension Mother   . Cancer Mother     lung cancer  . Heart disease Maternal Grandmother     History  Substance Use Topics  . Smoking status: Former Smoker -- 1.00 packs/day for 20 years  . Smokeless tobacco: Not on file  . Alcohol Use: No    Review of Systems   As noted in HPI  Filed Vitals:   06/15/14 1715  BP: 122/87  Pulse: 98  Temp: 97.9 F (36.6 C)  Resp: 16    Physical Exam  Physical Exam  Eyes: EOM are normal. Pupils are equal, round, and reactive to light.  Neck: Neck supple.  Cardiovascular: Normal rate and regular rhythm.   Pulmonary/Chest: Breath sounds normal. No respiratory distress. He has no wheezes. He has no rales.  Genitourinary:  Inguinal erythematous rash Right-sided epididymal tenderness, right testicle slightly enlarged  but nontender. No penile discharge  Skin:  Psoriatic patches on both elbows    CBC    Component Value Date/Time   WBC 6.3 11/01/2013 1227   RBC 5.00 11/01/2013 1227   HGB 15.5 11/01/2013 1227   HCT 44.7 11/01/2013 1227   PLT 320 11/01/2013 1227   MCV 89.4 11/01/2013 1227   LYMPHSABS 2.2 11/01/2013 1227   MONOABS 0.6 11/01/2013 1227   EOSABS 0.1 11/01/2013 1227   BASOSABS 0.1 11/01/2013 1227    CMP     Component Value Date/Time   NA 140 11/01/2013 1227   K 4.6 11/01/2013 1227   CL 104 11/01/2013 1227   CO2 28 11/01/2013 1227   GLUCOSE 102* 11/01/2013 1227   BUN 8 11/01/2013 1227   CREATININE 0.83 11/01/2013 1227   CREATININE 0.75 10/17/2009 2313   CALCIUM 9.5 11/01/2013 1227   PROT 6.9 11/01/2013 1227   ALBUMIN 4.4 11/01/2013 1227   AST 29  11/01/2013 1227   ALT 38 11/01/2013 1227   ALKPHOS 51 11/01/2013 1227   BILITOT 0.8 11/01/2013 1227   GFRNONAA >89 11/01/2013 1227   GFRAA >89 11/01/2013 1227    Lab Results  Component Value Date/Time   CHOL 164 11/01/2013 12:27 PM    No components found for: HGA1C  Lab Results  Component Value Date/Time   AST 29 11/01/2013 12:27 PM    Assessment and Plan  Testicular pain, right - Plan: have ordered US Scrotum  Epididymitis - Plan: amoxicillin-clavulanate (AUGMENTIN) 875-125 MG per tablet  Tinea cruris - Plan: clotrimazole-betamethasone (LOTRISONE) cream  Psoriasis - Plan: Ambulatory referral to Dermatology   Return in about 3 months (around 09/14/2014), or if symptoms worsen or fail to improve.  Doris Cheadle, MD

## 2014-06-16 ENCOUNTER — Telehealth: Payer: Self-pay

## 2014-06-16 NOTE — Telephone Encounter (Signed)
Called patient to let him know his US is scheduled for Tuesday Feb 2nd  At 7:30 patient is to arrive at 7:15am to register [facility]

## 2014-06-21 ENCOUNTER — Ambulatory Visit (HOSPITAL_COMMUNITY)
Admission: RE | Admit: 2014-06-21 | Discharge: 2014-06-21 | Disposition: A | Discharge status: Home / Self Care | Payer: Self-pay | Source: Ambulatory Visit | Attending: Internal Medicine | Admitting: Internal Medicine

## 2014-06-21 DIAGNOSIS — N50811 Right testicular pain: Secondary | ICD-10-CM

## 2014-06-21 DIAGNOSIS — N503 Cyst of epididymis: Secondary | ICD-10-CM | POA: Insufficient documentation

## 2014-06-22 ENCOUNTER — Telehealth: Payer: Self-pay

## 2014-06-22 DIAGNOSIS — N5082 Scrotal pain: Secondary | ICD-10-CM

## 2014-06-22 NOTE — Telephone Encounter (Signed)
-----   Message from Doris Cheadleeepak Advani, MD sent at 06/21/2014 12:45 PM EST ----- Call and let  the patient know that his scrotal ultrasound reported IMPRESSION: 1.4 cm right epididymal cyst. Otherwise negative exam. No evidence of torsion.  If patient is still having symptoms he can be referred to urology.

## 2014-06-22 NOTE — Telephone Encounter (Signed)
Spoke with patient and informed him of US results. Patient did want to be referred out to [facility]. Referral put in.

## 2014-06-28 ENCOUNTER — Encounter: Payer: Self-pay | Admitting: Internal Medicine

## 2014-07-08 ENCOUNTER — Encounter: Payer: Self-pay | Admitting: Internal Medicine

## 2014-07-08 ENCOUNTER — Ambulatory Visit: Payer: No Typology Code available for payment source

## 2014-08-08 ENCOUNTER — Ambulatory Visit: Payer: No Typology Code available for payment source | Admitting: Internal Medicine

## 2015-09-04 IMAGING — CR DG ANKLE COMPLETE 3+V*R*
3 series · 3 of 3 positions shown · non-contrast
Comparison: No priors.

CLINICAL DATA: Injury to the right ankle complaining of right ankle
pain.

EXAM:
RIGHT ANKLE - COMPLETE 3+ VIEW

[x ankle ap right]
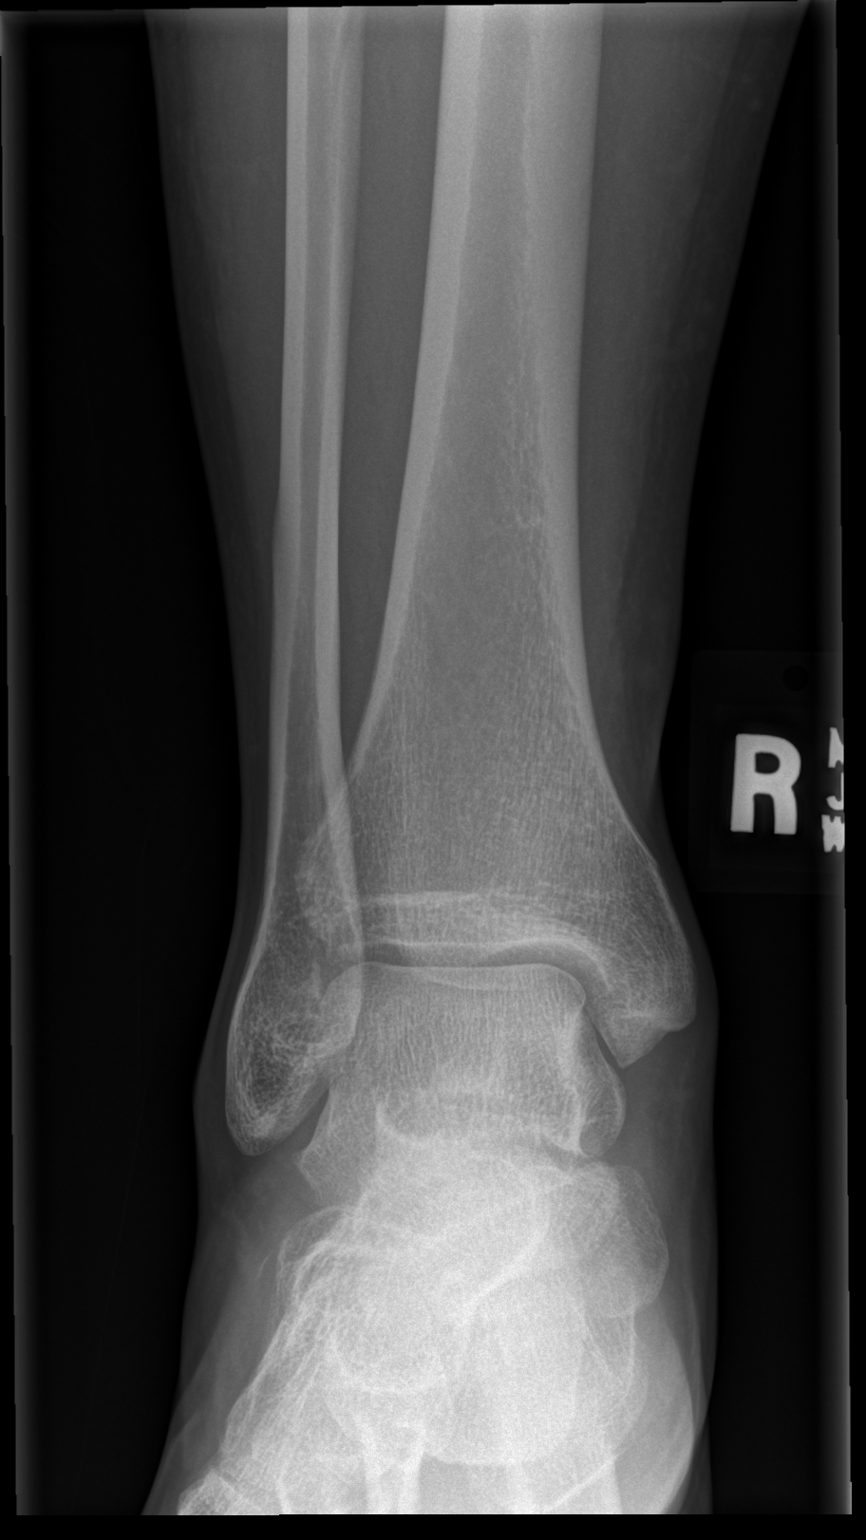

[x ankle obl right]
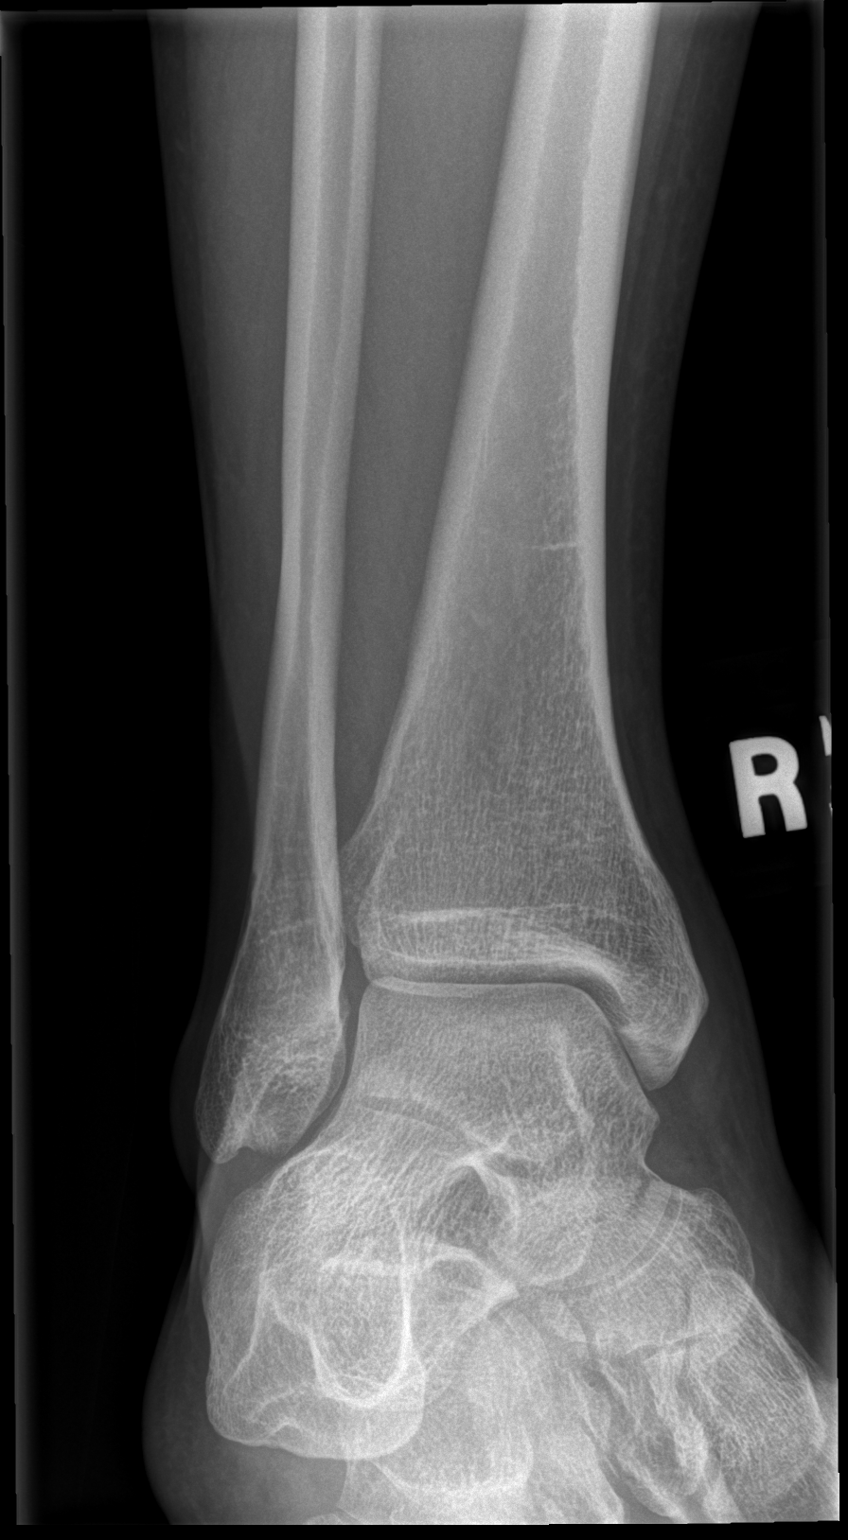

[x ankle lat right]
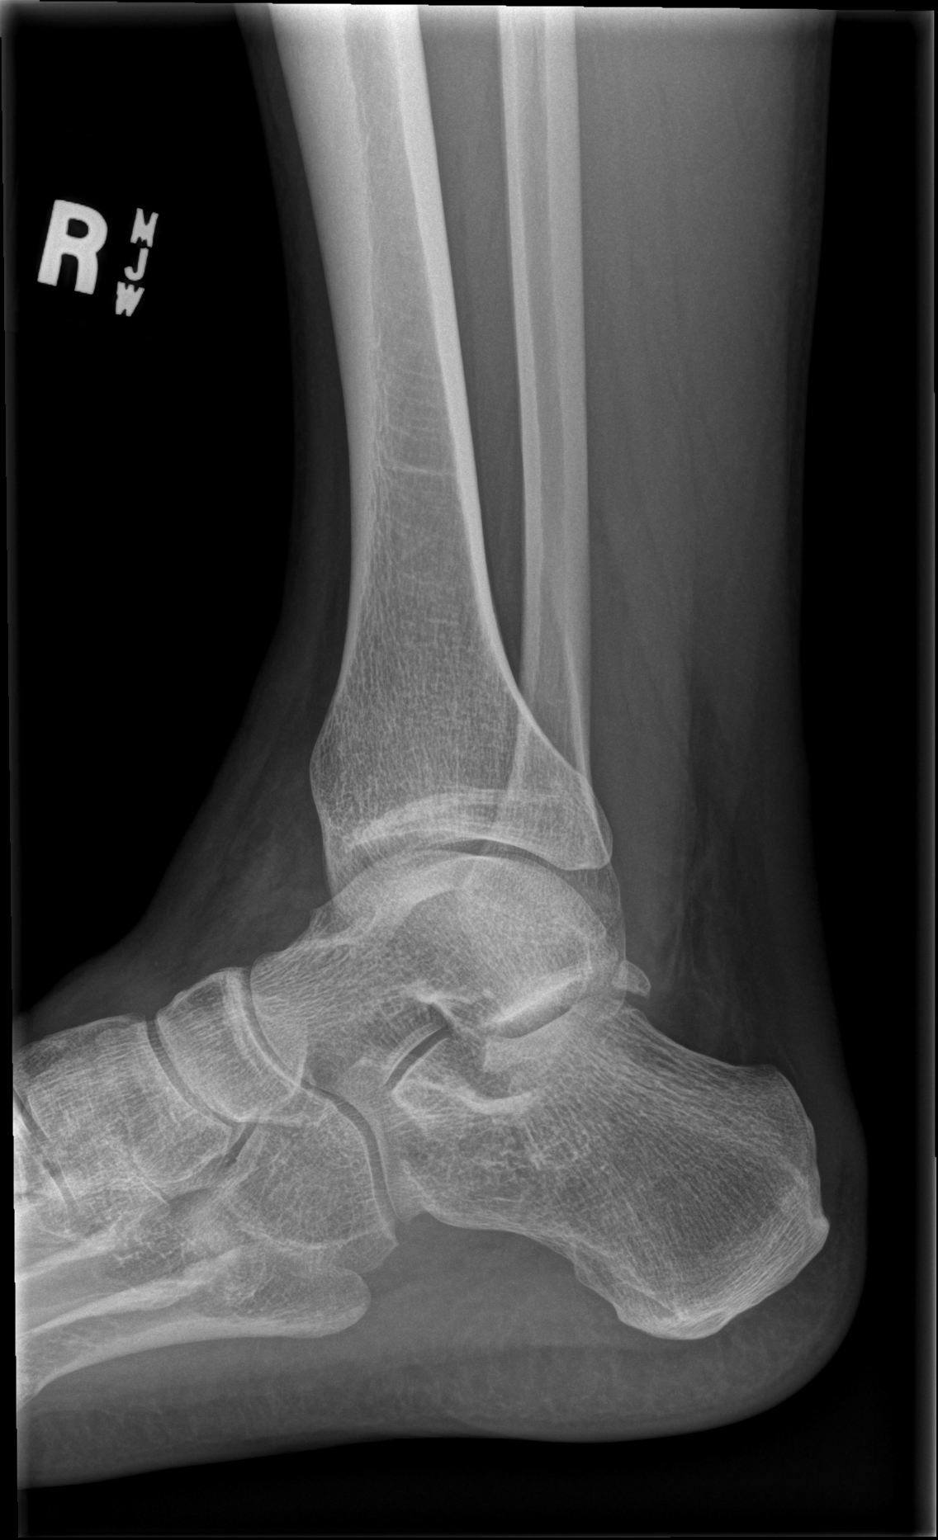

[3 of 3 positions shown; findings below may reference images not displayed]

FINDINGS: There is no evidence of fracture, dislocation, or joint effusion.
There is no evidence of arthropathy or other focal bone abnormality.
Os trigonum (normal variant) incidentally noted. Mild soft tissue
swelling anterior to the tibiotalar joint.
IMPRESSION: No radiographic evidence of significant acute traumatic injury to
the bones of the right ankle.

## 2016-01-30 ENCOUNTER — Other Ambulatory Visit: Payer: Self-pay | Admitting: Internal Medicine

## 2016-01-31 ENCOUNTER — Ambulatory Visit (INDEPENDENT_AMBULATORY_CARE_PROVIDER_SITE_OTHER): Payer: 59 | Admitting: Family Medicine

## 2016-01-31 VITALS — BP 132/80 | HR 62 | Temp 98.1°F | Resp 17 | Ht 71.0 in | Wt 183.0 lb

## 2016-01-31 DIAGNOSIS — A64 Unspecified sexually transmitted disease: Secondary | ICD-10-CM

## 2016-01-31 LAB — POCT URINALYSIS DIP (MANUAL ENTRY)
Bilirubin, UA: NEGATIVE
Blood, UA: NEGATIVE
GLUCOSE UA: NEGATIVE
Ketones, POC UA: NEGATIVE
Leukocytes, UA: NEGATIVE
Nitrite, UA: NEGATIVE
Protein Ur, POC: NEGATIVE
SPEC GRAV UA: 1.01
UROBILINOGEN UA: 0.2
pH, UA: 6.5

## 2016-01-31 LAB — POC MICROSCOPIC URINALYSIS (UMFC): Mucus: ABSENT

## 2016-01-31 LAB — HIV ANTIBODY (ROUTINE TESTING W REFLEX): HIV 1&2 Ab, 4th Generation: NONREACTIVE

## 2016-01-31 NOTE — Patient Instructions (Addendum)
It was good to meet you today!  You should be able to see your results on my chart in several days.  We will let you know the results of the tests today. We are doing a urine and blood tests.    IF you received an x-ray today, you will receive an invoice from [facility]. Please contact [facility] at [phone] with questions or concerns regarding your invoice.   IF you received labwork today, you will receive an invoice from [facility]. Please contact [facility] at [phone] with questions or concerns regarding your invoice.   Our billing staff will not be able to assist you with questions regarding bills from these companies.  You will be contacted with the lab results as soon as they are available. The fastest way to get your results is to activate your My Chart account. Instructions are located on the last page of this paperwork. If you have not heard from us regarding the results in 2 weeks, please contact this office.

## 2016-01-31 NOTE — Progress Notes (Signed)
   Keith LivingWilliam D Armstrong is a 50 y.o. male who presents to [facility] today for STD testing  1.  STD testing:  Patient presents today for STD testing. He is completely asymptomatic. He denies any history of STDs in the past. He does have positive hepatitis C is unsure how he obtained this. He denies any dysuria, and discharge, inguinal swelling, genital lesions either now or in the past. States he uses protection every time he has sex. Reports that he does have sex with women. No fevers or chills. No weight loss. No adenopathy. No abdominal pain or nausea or vomiting.  No night sweats.  ROS as above.    Family history of lung cancer in mother.   PMH reviewed. Patient is a nonsmoker.   Past Medical History:  Diagnosis Date  . Hepatitis C   . Hepatitis C virus    No past surgical history on file.  Medications reviewed. Current Outpatient Prescriptions  Medication Sig Dispense Refill  . Clobetasol Prop Emollient Base 0.05 % emollient cream Use to affected areas twice daily and PRN 30 g 0   No current facility-administered medications for this visit.      Physical Exam:  BP 132/80 (BP Location: Right Arm, Patient Position: Sitting, Cuff Size: Normal)   Pulse 62   Temp 98.1 F (36.7 C) (Oral)   Resp 17   Ht 5\' 11"  (1.803 m)   Wt 183 lb (83 kg)   SpO2 97%   BMI 25.52 kg/m  Gen:  Alert, cooperative patient who appears stated age in no acute distress.  Vital signs reviewed. HEENT: EOMI,  MMM Pulm:  Clear to auscultation bilaterally with good air movement.  No wheezes or rales noted.   Cardiac:  Regular rate and rhythm without murmur auscultated.  Good S1/S2. Abd:  Benign  Assessment and Plan:  1.  STD testing:  - testing today for urine GC/chlamydia - also blood tests for HIV, HSV, RPR.  Has previously been tested for Hep A and B - discussed false positive results for HSV-2 in asymptomatic patient.  He desires to be tested after discussion - Will call with  results.  Patient also wants a copy of results.

## 2016-02-01 LAB — GC/CHLAMYDIA PROBE AMP
CT Probe RNA: NOT DETECTED
GC Probe RNA: NOT DETECTED

## 2016-02-01 LAB — RPR

## 2016-02-01 LAB — HSV 2 ANTIBODY, IGG: HSV 2 Glycoprotein G Ab, IgG: <0.9 Index (ref <0.90)

## 2016-02-09 ENCOUNTER — Encounter: Payer: Self-pay | Admitting: *Deleted

## 2016-03-08 ENCOUNTER — Telehealth: Payer: Self-pay

## 2016-03-08 NOTE — Telephone Encounter (Signed)
Pt was just recently seen here in our office and was curious if the provider could send in a refill on the clobetasol as the provider here was aware of his condition   Contact [phone]

## 2016-03-12 ENCOUNTER — Other Ambulatory Visit: Payer: Self-pay | Admitting: *Deleted

## 2016-03-12 ENCOUNTER — Other Ambulatory Visit: Payer: 59

## 2016-03-12 DIAGNOSIS — Z7251 High risk heterosexual behavior: Secondary | ICD-10-CM

## 2016-03-12 LAB — COMPLETE METABOLIC PANEL WITH GFR
ALBUMIN: 4.1 g/dL (ref 3.6–5.1)
ALK PHOS: 49 U/L (ref 40–115)
ALT: 84 U/L — ABNORMAL HIGH (ref 9–46)
AST: 46 U/L — AB (ref 10–40)
BUN: 13 mg/dL (ref 7–25)
CHLORIDE: 104 mmol/L (ref 98–110)
CO2: 28 mmol/L (ref 20–31)
Calcium: 8.9 mg/dL (ref 8.6–10.3)
Creat: 0.85 mg/dL (ref 0.60–1.35)
GFR, Est African American: >89 mL/min (ref >=60)
GFR, Est Non African American: >89 mL/min (ref >=60)
GLUCOSE: 94 mg/dL (ref 65–99)
POTASSIUM: 3.9 mmol/L (ref 3.5–5.3)
SODIUM: 140 mmol/L (ref 135–146)
Total Bilirubin: 0.4 mg/dL (ref 0.2–1.2)
Total Protein: 6.8 g/dL (ref 6.1–8.1)

## 2016-03-12 MED ORDER — CLOBETASOL PROP EMOLLIENT BASE 0.05 % EX CREA: TOPICAL_CREAM | CUTANEOUS | 1 refills | Status: DC

## 2016-03-12 NOTE — Telephone Encounter (Signed)
It looks like this was sent in today by Dr. Drue SecondSnider.  We did not discuss why he is on Clobetasol.  He should get this from his primary care provider.

## 2016-03-13 LAB — HIV ANTIBODY (ROUTINE TESTING W REFLEX): HIV 1&2 Ab, 4th Generation: NONREACTIVE

## 2016-03-26 ENCOUNTER — Encounter: Payer: Self-pay | Admitting: Internal Medicine

## 2016-03-26 ENCOUNTER — Ambulatory Visit (INDEPENDENT_AMBULATORY_CARE_PROVIDER_SITE_OTHER): Payer: 59 | Admitting: Internal Medicine

## 2016-03-26 ENCOUNTER — Other Ambulatory Visit: Payer: Self-pay | Admitting: Internal Medicine

## 2016-03-26 VITALS — BP 126/80 | HR 65 | Temp 98.3°F | Wt 183.0 lb

## 2016-03-26 DIAGNOSIS — B182 Chronic viral hepatitis C: Secondary | ICD-10-CM

## 2016-03-26 MED ORDER — CLOBETASOL PROPIONATE 0.05 % EX CREA: 1.0000 "application " | TOPICAL_CREAM | Freq: Two times a day (BID) | CUTANEOUS | 0 refills | Status: DC

## 2016-03-26 NOTE — Progress Notes (Signed)
[facility]   CC: consideration for treatment for chronic hepatitis C  HPI:  Keith LivingWilliam D Armstrong is a 50 y.o. male who presents for initial evaluation and management of chronic hepatitis C. That we originally saw him in 2015 for GT3a.Hep C.   hepatitis C first diagnosed in 2006, found out through donating plasma . Hepatitis C risk factors present are: he previously alcohol abuse, only once IVDU in 1989 and often shared pipes,  sober since 2007. he works at [employer]. Patient denies any recent drug use, not in a relationship with anyone that has hepatitis C. Not known to have a sex partner with Hep C. Patient does not  have a past history of liver disease. Patient does not have a family history of liver disease.   In 2015, he was uninsured, nad unable to get access to treatment at that time. He comes back to clinic for re-evaluation for treatment   He has received 3 doses of hep b vaccine and 1 dose of hep A vaccine   Review of Systems:   All other systems reviewed and are negative       Past Medical History:  Diagnosis Date  . Hepatitis C   . Hepatitis C virus     Prior to Admission medications   Medication Sig Start Date End Date Taking? Authorizing Provider  Clobetasol Prop Emollient Base 0.05 % emollient cream Use to affected areas twice daily and PRN 03/12/16  Yes Judyann Munsonynthia Lonney Revak, MD    Allergies  Allergen Reactions  . Bee Venom Anaphylaxis    Social History  Substance Use Topics  . Smoking status: Former Smoker    Packs/day: 1.00    Years: 20.00  . Smokeless tobacco: Not on file  . Alcohol use No    Family History  Problem Relation Age of Onset  . Diabetes Other   . Heart failure Other   . Hypertension Mother   . Cancer Mother     lung cancer  . Heart disease Maternal Grandmother       Objective:   Vitals:   03/26/16 1623  BP: 126/80  Pulse: 65  Temp: 98.3 F (36.8 C)   Physical Exam    Constitutional: He is oriented to person, place, and time. He appears well-developed and well-nourished. No distress.  HENT: no scleral icterus Mouth/Throat: Oropharynx is clear and moist. No oropharyngeal exudate.  Cardiovascular: Normal rate, regular rhythm and normal heart sounds. Exam reveals no gallop and no friction rub.  No murmur heard.  Pulmonary/Chest: Effort normal and breath sounds normal. No respiratory distress. He has no wheezes.  Abdominal: Soft. Bowel sounds are normal. He exhibits no distension. There is no tenderness.  Lymphadenopathy:  He has no cervical adenopathy.  Neurological: He is alert and oriented to person, place, and time.  Skin: Skin is warm and dry. No rash noted. No erythema.  Psychiatric: He has a normal mood and affect. His behavior is normal.     Laboratory Genotype:  Lab Results  Component Value Date   HCVGENOTYPE 3 11/10/2013   HCV viral load:  Lab Results  Component Value Date   HCVQUANT 656,914 (H) 11/10/2013   Lab Results  Component Value Date   WBC 6.3 11/01/2013   HGB 15.5 11/01/2013   HCT 44.7 11/01/2013   MCV 89.4 11/01/2013   PLT 320 11/01/2013    Lab Results  Component Value Date   CREATININE 0.85 03/12/2016   BUN  13 03/12/2016   NA 140 03/12/2016   K 3.9 03/12/2016   CL 104 03/12/2016   CO2 28 03/12/2016    Lab Results  Component Value Date   ALT 84 (H) 03/12/2016   AST 46 (H) 03/12/2016   ALKPHOS 49 03/12/2016     Labs and history reviewed and show CHILD-PUGH -unable to calculate without INR  Assessment: New Patient with Chronic Hepatitis C genotype 3a, untreated.   I discussed with the patient the lab findings that confirm chronic hepatitis C as well as the natural history and progression of disease including about 30% of people who develop cirrhosis of the liver if left untreated and once cirrhosis is established there is a 2-7% risk per year of liver cancer and liver failure.  I discussed the importance of  treatment and benefits in reducing the risk, even if significant liver fibrosis exists.   Plan: 1) Patient counseled extensively on limiting acetaminophen to no more than 2 grams daily, avoidance of alcohol. 2) Transmission discussed with patient including sexual transmission, sharing razors and toothbrush.   3) will check viral load, fibrosure, INR,  4) will check hep B surface antibody and also give hep A vaccine #2, and flu vaccine 5) Further work up to include liver staging with elastography 6) anticipate to give him, mavyret x 8 weeks

## 2016-03-27 LAB — PROTIME-INR
INR: 1
Prothrombin Time: 10.4 s (ref 9.0–11.5)

## 2016-03-27 LAB — HEPATITIS B SURFACE ANTIBODY,QUALITATIVE: HEP B S AB: POSITIVE — AB

## 2016-03-28 LAB — HEPATITIS C RNA QUANTITATIVE
HCV QUANT: 21064891 [IU]/mL — AB (ref <15)
HCV Quantitative Log: 7.32 {Log} — ABNORMAL HIGH (ref <1.18)

## 2016-03-30 LAB — LIVER FIBROSIS, FIBROTEST-ACTITEST
ALPHA-2-MACROGLOBULIN: 308 mg/dL — AB (ref 106–279)
ALT: 85 U/L — AB (ref 9–46)
APOLIPOPROTEIN A1: 116 mg/dL (ref 94–176)
Bilirubin: 0.5 mg/dL (ref 0.2–1.2)
FIBROSIS SCORE: 0.49
GGT: 22 U/L (ref 3–95)
Haptoglobin: 89 mg/dL (ref 43–212)
Necroinflammat ACT Score: 0.57
Reference ID: 1689094

## 2016-04-01 ENCOUNTER — Other Ambulatory Visit: Payer: Self-pay | Admitting: Pharmacist

## 2016-04-01 MED ORDER — GLECAPREVIR-PIBRENTASVIR 100-40 MG PO TABS: 3.0000 | ORAL_TABLET | Freq: Every day | ORAL | 2 refills | Status: DC

## 2016-04-02 ENCOUNTER — Encounter: Payer: Self-pay | Admitting: Internal Medicine

## 2016-04-02 ENCOUNTER — Other Ambulatory Visit: Payer: Self-pay | Admitting: Pharmacist

## 2016-04-02 MED ORDER — GLECAPREVIR-PIBRENTASVIR 100-40 MG PO TABS: 3.0000 | ORAL_TABLET | Freq: Every day | ORAL | 2 refills | Status: DC

## 2016-04-05 ENCOUNTER — Telehealth: Payer: Self-pay | Admitting: Pharmacist Clinician (PhC)/ Clinical Pharmacy Specialist

## 2016-04-05 NOTE — Telephone Encounter (Signed)
Keith Armstrong called about the issue he got from [employer] with his Mavyret. [employer] told him that his copay is >$1000. He has Nurse, learning disabilitycommercial insurance so I gave him specific directions on how to activate the copay card and give to [employer] after it's activated. He is going to do immediately after our conversation.

## 2016-04-09 ENCOUNTER — Encounter: Payer: Self-pay | Admitting: Pharmacy Technician

## 2016-04-23 ENCOUNTER — Ambulatory Visit (HOSPITAL_COMMUNITY)
Admission: RE | Admit: 2016-04-23 | Discharge: 2016-04-23 | Disposition: A | Discharge status: Home / Self Care | Payer: 59 | Source: Ambulatory Visit | Attending: Internal Medicine | Admitting: Internal Medicine

## 2016-04-23 DIAGNOSIS — B182 Chronic viral hepatitis C: Secondary | ICD-10-CM | POA: Diagnosis present

## 2016-04-23 DIAGNOSIS — K760 Fatty (change of) liver, not elsewhere classified: Secondary | ICD-10-CM | POA: Diagnosis not present

## 2016-04-25 ENCOUNTER — Ambulatory Visit (INDEPENDENT_AMBULATORY_CARE_PROVIDER_SITE_OTHER): Payer: 59 | Admitting: Pharmacist Clinician (PhC)/ Clinical Pharmacy Specialist

## 2016-04-25 DIAGNOSIS — B182 Chronic viral hepatitis C: Secondary | ICD-10-CM

## 2016-04-25 NOTE — Progress Notes (Signed)
HPI: Keith Armstrong is a 50 y.o. male who is here for his hep C visit with pharmacy.  Lab Results  Component Value Date   HCVGENOTYPE 3 11/10/2013    Allergies: Allergies  Allergen Reactions  . Bee Venom Anaphylaxis    Vitals:    Past Medical History: Past Medical History:  Diagnosis Date  . Hepatitis C   . Hepatitis C virus     Social History: Social History   Social History  . Marital status: Single    Spouse name: N/A  . Number of children: N/A  . Years of education: N/A   Social History Main Topics  . Smoking status: Former Smoker    Packs/day: 1.00    Years: 20.00  . Smokeless tobacco: Not on file  . Alcohol use No  . Drug use: No     Comment: last use was 03/2006  . Sexual activity: No   Other Topics Concern  . Not on file   Social History Narrative  . No narrative on file    Labs: Hep B S Ab (no units)  Date Value  03/26/2016 POS (A)   Hepatitis B Surface Ag (no units)  Date Value  11/10/2013 NEGATIVE   HCV Ab (no units)  Date Value  01/07/2008 REACTIVE (A)    Lab Results  Component Value Date   HCVGENOTYPE 3 11/10/2013    Hepatitis C RNA quantitative Latest Ref Rng & Units 03/26/2016 11/10/2013 02/02/2008  HCV Quantitative <15 IU/mL 78,295,621(H21,064,891(H) 656,914(H) 2980000(H)  HCV Quantitative Log <1.18 log 10 7.32(H) 5.82(H) -    AST  Date Value  03/12/2016 46 U/L (H)  11/01/2013 29 U/L  10/17/2009 21 units/L   ALT  Date Value  03/26/2016 85 U/L (H)  03/12/2016 84 U/L (H)  11/01/2013 38 U/L  10/17/2009 28 units/L   INR (no units)  Date Value  03/26/2016 1.0  11/10/2013 0.95    CrCl: CrCl cannot be calculated (Patient's most recent lab result is older than the maximum 21 days allowed.).  Fibrosis Score: F3/4 as assessed by ARFI  Child-Pugh Score: Class A  Previous Treatment Regimen: None  Assessment: Keith Armstrong started on his Mavyret on 11/21 for his genotype 3 hepatitis. He is very religious with his meds so he has  not missed any doses. He reported no side effects with the meds. He doesn't take anything over the counter.  When we got him approved for Mavyret, his fibrosis score was F2 based on the Fibrosure but the elastography that was done on 12/5 showed an F3/4 so we'll appeal his insurance to extend from 8>>12 weeks instead. He also has the harder to treat genotype also so he would be better off with the 12 wks.   He is going to come back for the 4wks VL and the cure VL. Then he will f/u with Dr. Drue SecondSnider for the cure visit.   Recommendations:  Cont Mavyret 3 tabs qday with food for 3 months Will appeal insurance for an additional 4 wks F/u with labs in 2 wks [facility] with Dr Drue SecondSnider 1 wk after [facility]  Ulyses [place]ward[place], Steven Basso CoplayQuang, Vermont[place]arm.D., BCPS, AAHIVP Clinical Infectious Disease Pharmacist [facility] 04/25/2016, 10:32 AM

## 2016-04-25 NOTE — Patient Instructions (Signed)
Cont Mavyret for 3 full months Come back for labs in 2 wks and 3 months after finish therapy Follow up with Dr. Drue SecondSnider in May

## 2016-05-09 ENCOUNTER — Other Ambulatory Visit: Payer: 59

## 2016-05-09 DIAGNOSIS — B182 Chronic viral hepatitis C: Secondary | ICD-10-CM

## 2016-05-14 LAB — HEPATITIS C RNA QUANTITATIVE: HCV QUANT: NOT DETECTED [IU]/mL (ref <15)

## 2016-05-17 ENCOUNTER — Telehealth: Payer: Self-pay | Admitting: Pharmacist Clinician (PhC)/ Clinical Pharmacy Specialist

## 2016-05-17 NOTE — Telephone Encounter (Signed)
Appeal team will call back to do a peer2peer to extend his course to 12 wks. If not, appeal forms are in the top drawer.

## 2016-05-29 ENCOUNTER — Telehealth: Payer: Self-pay | Admitting: Pharmacist Clinician (PhC)/ Clinical Pharmacy Specialist

## 2016-05-29 NOTE — Telephone Encounter (Signed)
Called to let [name] knows that his insurance will only approve for 8 wks after several appeals.

## 2016-10-07 ENCOUNTER — Other Ambulatory Visit: Payer: 59

## 2016-10-07 DIAGNOSIS — B182 Chronic viral hepatitis C: Secondary | ICD-10-CM

## 2016-10-09 LAB — HEPATITIS C RNA QUANTITATIVE
HCV QUANT: NOT DETECTED [IU]/mL
HCV Quantitative Log: <1.18 Log IU/mL

## 2016-10-17 ENCOUNTER — Ambulatory Visit (INDEPENDENT_AMBULATORY_CARE_PROVIDER_SITE_OTHER): Payer: 59 | Admitting: Internal Medicine

## 2016-10-17 ENCOUNTER — Encounter: Payer: Self-pay | Admitting: Internal Medicine

## 2016-10-17 VITALS — BP 108/71 | HR 56 | Temp 97.8°F | Ht 70.0 in | Wt 183.0 lb

## 2016-10-17 DIAGNOSIS — B182 Chronic viral hepatitis C: Secondary | ICD-10-CM | POA: Diagnosis not present

## 2016-10-19 NOTE — Progress Notes (Signed)
RFV: follow up on finishing hep C treatment Patient ID: Keith Armstrong, male   DOB: 04-15-1966, 51 y.o.   MRN: [id number]  HPI 51yo M with history of chronic hep C without hepatic com, GT3 with F3/F4 finshed 8 wk of mavyret. Unable to get coverage for addn month of treatment. He had just finished his labs fro SVR 12 which is negative. Otherwise he is doing well. He has questions to whether he can donate blood, risk of sexual transmission.  Outpatient Encounter Prescriptions as of 10/17/2016  Medication Sig  . sildenafil (VIAGRA) 100 MG tablet Take 100 mg by mouth as needed for erectile dysfunction.  . Clobetasol Prop Emollient Base 0.05 % emollient cream Use to affected areas twice daily and PRN (Patient not taking: Reported on 04/25/2016)  . [DISCONTINUED] clobetasol cream (TEMOVATE) 0.05 % Apply 1 application topically 2 (two) times daily. To affected area as needed. 1 week on, 1 week off  . [DISCONTINUED] Glecaprevir-Pibrentasvir (MAVYRET) 100-40 MG TABS Take 3 tablets by mouth daily with breakfast.   No facility-administered encounter medications on file as of 10/17/2016.      Patient Active Problem List   Diagnosis Date Noted  . Right ankle pain 11/01/2013  . Excess ear wax 11/01/2013  . BRADYCARDIA 03/13/2010  . HEPATITIS C 01/03/2010  . RIGHT BUNDLE BRANCH BLOCK 01/03/2010  . CHEST DISCOMFORT 01/03/2010     Health Maintenance Due  Topic Date Due  . Janet Berlin  04/08/1987     Review of Systems Review of Systems  Constitutional: Negative for fever, chills, diaphoresis, activity change, appetite change, fatigue and unexpected weight change.  HENT: Negative for congestion, sore throat, rhinorrhea, sneezing, trouble swallowing and sinus pressure.  Eyes: Negative for photophobia and visual disturbance.  Respiratory: Negative for cough, chest tightness, shortness of breath, wheezing and stridor.  Cardiovascular: Negative for chest pain, palpitations and leg swelling.    Gastrointestinal: Negative for nausea, vomiting, abdominal pain, diarrhea, constipation, blood in stool, abdominal distention and anal bleeding.  Genitourinary: Negative for dysuria, hematuria, flank pain and difficulty urinating.  Musculoskeletal: Negative for myalgias, back pain, joint swelling, arthralgias and gait problem.  Skin: Negative for color change, pallor, rash and wound.  Neurological: Negative for dizziness, tremors, weakness and light-headedness.  Hematological: Negative for adenopathy. Does not bruise/bleed easily.  Psychiatric/Behavioral: Negative for behavioral problems, confusion, sleep disturbance, dysphoric mood, decreased concentration and agitation.    Physical Exam   BP 108/71   Pulse (!) 56   Temp 97.8 F (36.6 C) (Oral)   Ht 5\' 10"  (1.778 m)   Wt 183 lb (83 kg)   BMI 26.26 kg/m   Physical Exam  Constitutional: He is oriented to person, place, and time. He appears well-developed and well-nourished. No distress.  HENT:  Mouth/Throat: Oropharynx is clear and moist. No oropharyngeal exudate.  Abdominal: Soft. Bowel sounds are normal. He exhibits no distension. There is no tenderness.  Neurological: He is alert and oriented to person, place, and time.  Skin: Skin is warm and dry. No rash noted. No erythema.  Psychiatric: He has a normal mood and affect. His behavior is normal.    Lab Results  Component Value Date   HEPBSAB POS (A) 03/26/2016   Lab Results  Component Value Date   LABRPR NON REAC 01/31/2016    CBC Lab Results  Component Value Date   WBC 6.3 11/01/2013   RBC 5.00 11/01/2013   HGB 15.5 11/01/2013   HCT 44.7 11/01/2013   PLT 320  11/01/2013   MCV 89.4 11/01/2013   MCH 31.0 11/01/2013   MCHC 34.7 11/01/2013   RDW 13.7 11/01/2013   LYMPHSABS 2.2 11/01/2013   MONOABS 0.6 11/01/2013   EOSABS 0.1 11/01/2013    BMET Lab Results  Component Value Date   NA 140 03/12/2016   K 3.9 03/12/2016   CL 104 03/12/2016   CO2 28 03/12/2016    GLUCOSE 94 03/12/2016   BUN 13 03/12/2016   CREATININE 0.85 03/12/2016   CALCIUM 8.9 03/12/2016   GFRNONAA >89 03/12/2016   GFRAA >89 03/12/2016      Assessment and Plan  Chronic hepatitis C without hepatic coma = we will see back in 3 months to see how he is doing as well as arrange [facility]. Thus far based on SVR12 results he has been cured of hepatitis C. He is looking to establish care with pcp. Will give a list for him

## 2016-11-04 DIAGNOSIS — L400 Psoriasis vulgaris: Secondary | ICD-10-CM | POA: Diagnosis not present

## 2016-11-05 ENCOUNTER — Telehealth: Payer: Self-pay | Admitting: *Deleted

## 2016-11-05 NOTE — Telephone Encounter (Signed)
Patient called to see what labs were drawn recently. His dermatologist would like to draw bloodwork, patient does not want duplicate labs/charges if they are not necessary. As patient only has HCV bloodwork at our office, he will proceed with bloodwork at dermatologist's office.  Andree CossHowell, Michelle M, RN

## 2017-01-08 ENCOUNTER — Other Ambulatory Visit: Payer: 59

## 2017-01-08 DIAGNOSIS — B182 Chronic viral hepatitis C: Secondary | ICD-10-CM

## 2017-01-10 LAB — HEPATITIS C RNA QUANTITATIVE
HCV Quantitative Log: <1.18 Log IU/mL
HCV Quantitative: <15 IU/mL

## 2017-01-21 ENCOUNTER — Encounter: Payer: Self-pay | Admitting: Internal Medicine

## 2017-01-22 ENCOUNTER — Ambulatory Visit: Payer: 59 | Admitting: Internal Medicine

## 2017-01-27 ENCOUNTER — Telehealth: Payer: Self-pay | Admitting: General Practice

## 2017-01-27 NOTE — Telephone Encounter (Signed)
Pt called in and would like to know if you would be willing to take him on as a new pt  °

## 2017-01-29 NOTE — Telephone Encounter (Signed)
Ok with me 

## 2017-02-07 ENCOUNTER — Ambulatory Visit: Payer: 59 | Admitting: Internal Medicine

## 2017-02-12 ENCOUNTER — Ambulatory Visit (INDEPENDENT_AMBULATORY_CARE_PROVIDER_SITE_OTHER): Payer: 59 | Admitting: Internal Medicine

## 2017-02-12 ENCOUNTER — Encounter: Payer: Self-pay | Admitting: Internal Medicine

## 2017-02-12 ENCOUNTER — Other Ambulatory Visit (INDEPENDENT_AMBULATORY_CARE_PROVIDER_SITE_OTHER): Payer: 59

## 2017-02-12 VITALS — BP 110/72 | HR 62 | Temp 97.9°F | Ht 70.0 in | Wt 181.0 lb

## 2017-02-12 DIAGNOSIS — Z0000 Encounter for general adult medical examination without abnormal findings: Secondary | ICD-10-CM

## 2017-02-12 DIAGNOSIS — Z800 Family history of malignant neoplasm of digestive organs: Secondary | ICD-10-CM

## 2017-02-12 DIAGNOSIS — Z0001 Encounter for general adult medical examination with abnormal findings: Secondary | ICD-10-CM | POA: Insufficient documentation

## 2017-02-12 LAB — PSA: PSA: 1.37 ng/mL (ref 0.10–4.00)

## 2017-02-12 LAB — HEPATIC FUNCTION PANEL
ALBUMIN: 4.4 g/dL (ref 3.5–5.2)
ALK PHOS: 58 U/L (ref 39–117)
ALT: 11 U/L (ref 0–53)
AST: 14 U/L (ref 0–37)
BILIRUBIN TOTAL: 0.6 mg/dL (ref 0.2–1.2)
Bilirubin, Direct: 0.1 mg/dL (ref 0.0–0.3)
Total Protein: 7 g/dL (ref 6.0–8.3)

## 2017-02-12 LAB — LIPID PANEL
CHOL/HDL RATIO: 3
Cholesterol: 188 mg/dL (ref 0–200)
HDL: 56.1 mg/dL (ref >39.00)
LDL CALC: 122 mg/dL — AB (ref 0–99)
NonHDL: 132.23
Triglycerides: 51 mg/dL (ref 0.0–149.0)
VLDL: 10.2 mg/dL (ref 0.0–40.0)

## 2017-02-12 LAB — CBC WITH DIFFERENTIAL/PLATELET
BASOS PCT: 0.7 % (ref 0.0–3.0)
Basophils Absolute: 0 10*3/uL (ref 0.0–0.1)
EOS PCT: 3.7 % (ref 0.0–5.0)
Eosinophils Absolute: 0.2 10*3/uL (ref 0.0–0.7)
HCT: 47 % (ref 39.0–52.0)
HEMOGLOBIN: 15.6 g/dL (ref 13.0–17.0)
Lymphocytes Relative: 38.8 % (ref 12.0–46.0)
Lymphs Abs: 2.3 10*3/uL (ref 0.7–4.0)
MCHC: 33.2 g/dL (ref 30.0–36.0)
MCV: 96.6 fl (ref 78.0–100.0)
MONO ABS: 0.6 10*3/uL (ref 0.1–1.0)
Monocytes Relative: 10 % (ref 3.0–12.0)
Neutro Abs: 2.7 10*3/uL (ref 1.4–7.7)
Neutrophils Relative %: 46.8 % (ref 43.0–77.0)
Platelets: 295 10*3/uL (ref 150.0–400.0)
RBC: 4.86 Mil/uL (ref 4.22–5.81)
RDW: 12.9 % (ref 11.5–15.5)
WBC: 5.8 10*3/uL (ref 4.0–10.5)

## 2017-02-12 LAB — URINALYSIS, ROUTINE W REFLEX MICROSCOPIC
Bilirubin Urine: NEGATIVE
Hgb urine dipstick: NEGATIVE
KETONES UR: NEGATIVE
Leukocytes, UA: NEGATIVE
Nitrite: NEGATIVE
PH: 6 (ref 5.0–8.0)
RBC / HPF: NONE SEEN (ref 0–?)
SPECIFIC GRAVITY, URINE: 1.02 (ref 1.000–1.030)
Total Protein, Urine: NEGATIVE
URINE GLUCOSE: NEGATIVE
UROBILINOGEN UA: 0.2 (ref 0.0–1.0)
WBC, UA: NONE SEEN (ref 0–?)

## 2017-02-12 LAB — BASIC METABOLIC PANEL
BUN: 10 mg/dL (ref 6–23)
CALCIUM: 9.3 mg/dL (ref 8.4–10.5)
CO2: 30 mEq/L (ref 19–32)
Chloride: 105 mEq/L (ref 96–112)
Creatinine, Ser: 0.76 mg/dL (ref 0.40–1.50)
GFR: 115.93 mL/min (ref >60.00)
Glucose, Bld: 106 mg/dL — ABNORMAL HIGH (ref 70–99)
POTASSIUM: 4.9 meq/L (ref 3.5–5.1)
SODIUM: 139 meq/L (ref 135–145)

## 2017-02-12 LAB — TSH: TSH: 1.54 u[IU]/mL (ref 0.35–4.50)

## 2017-02-12 NOTE — Patient Instructions (Signed)

## 2017-02-12 NOTE — Progress Notes (Signed)
Subjective:    Patient ID: Keith Armstrong, male    DOB: 1966-05-02, 51 y.o.   MRN: [id number]  HPI    Here for wellness and f/u;  Overall doing ok;  Pt denies Chest pain, worsening SOB, DOE, wheezing, orthopnea, PND, worsening LE edema, palpitations, dizziness or syncope.  Pt denies neurological change such as new headache, facial or extremity weakness.  Pt denies polydipsia, polyuria, or low sugar symptoms. Pt states overall good compliance with treatment and medications, good tolerability, and has been trying to follow appropriate diet.  Pt denies worsening depressive symptoms, suicidal ideation or panic. No fever, night sweats, wt loss, loss of appetite, or other constitutional symptoms.  Pt states good ability with ADL's, has low fall risk, home safety reviewed and adequate, no other significant changes in hearing or vision, and only occasionally active with exercise.  Declines flu shot for now.  Hep C cleared with tx, no further ID follow up needed. Has FH colon cancer - due for colonoscopy.  Former smoker > 10 yrs Past Medical History:  Diagnosis Date  . Hepatitis C   . Hepatitis C virus    No past surgical history on file.  reports that he has quit smoking. He has a 20.00 pack-year smoking history. He has never used smokeless tobacco. He reports that he does not drink alcohol or use drugs. family history includes Cancer in his mother; Diabetes in his other; Heart disease in his maternal grandmother; Heart failure in his other; Hypertension in his mother. Allergies  Allergen Reactions  . Bee Venom Anaphylaxis   Current Outpatient Prescriptions on File Prior to Visit  Medication Sig Dispense Refill  . Clobetasol Prop Emollient Base 0.05 % emollient cream Use to affected areas twice daily and PRN 30 g 1  . sildenafil (VIAGRA) 100 MG tablet Take 100 mg by mouth as needed for erectile dysfunction.     No current facility-administered medications on file prior to visit.    Review of  Systems Constitutional: Negative for other unusual diaphoresis, sweats, appetite or weight changes HENT: Negative for other worsening hearing loss, ear pain, facial swelling, mouth sores or neck stiffness.   Eyes: Negative for other worsening pain, redness or other visual disturbance.  Respiratory: Negative for other stridor or swelling Cardiovascular: Negative for other palpitations or other chest pain  Gastrointestinal: Negative for worsening diarrhea or loose stools, blood in stool, distention or other pain Genitourinary: Negative for hematuria, flank pain or other change in urine volume.  Musculoskeletal: Negative for myalgias or other joint swelling.  Skin: Negative for other color change, or other wound or worsening drainage.  Neurological: Negative for other syncope or numbness. Hematological: Negative for other adenopathy or swelling Psychiatric/Behavioral: Negative for hallucinations, other worsening agitation, SI, self-injury, or new decreased concentration All other system neg per pt    Objective:   Physical Exam BP 110/72   Pulse 62   Temp 97.9 F (36.6 C) (Oral)   Ht  (1.778 m)   Wt 181 lb (82.1 kg)   SpO2 99%   BMI 25.97 kg/m  VS noted,  Constitutional: Pt is oriented to person, place, and time. Appears well-developed and well-nourished, in no significant distress and comfortable Head: Normocephalic and atraumatic  Eyes: Conjunctivae and EOM are normal. Pupils are equal, round, and reactive to light Right Ear: External ear normal without discharge Left Ear: External ear normal without discharge Nose: Nose without discharge or deformity Mouth/Throat: Oropharynx is without other ulcerations and moist  Neck: Normal range of motion. Neck supple. No JVD present. No tracheal deviation present or significant neck LA or mass Cardiovascular: Normal rate, regular rhythm, normal heart sounds and intact distal pulses.   Pulmonary/Chest: WOB normal and breath sounds without  rales or wheezing  Abdominal: Soft. Bowel sounds are normal. NT. No HSM  Musculoskeletal: Normal range of motion. Exhibits no edema Lymphadenopathy: Has no other cervical adenopathy.  Neurological: Pt is alert and oriented to person, place, and time. Pt has normal reflexes. No cranial nerve deficit. Motor grossly intact, Gait intact Skin: Skin is warm and dry. No rash noted or new ulcerations Psychiatric:  Has normal mood and affect. Behavior is normal without agitation No other exam findings    Assessment & Plan:

## 2017-02-13 ENCOUNTER — Encounter: Payer: Self-pay | Admitting: Internal Medicine

## 2017-02-15 NOTE — Assessment & Plan Note (Signed)
For colonoscopy as is due 

## 2017-02-15 NOTE — Assessment & Plan Note (Signed)

## 2017-04-09 ENCOUNTER — Ambulatory Visit (INDEPENDENT_AMBULATORY_CARE_PROVIDER_SITE_OTHER): Payer: 59 | Admitting: Internal Medicine

## 2017-04-09 ENCOUNTER — Encounter: Payer: Self-pay | Admitting: Internal Medicine

## 2017-04-09 VITALS — BP 112/62 | HR 64 | Ht 69.75 in | Wt 186.1 lb

## 2017-04-09 DIAGNOSIS — Z800 Family history of malignant neoplasm of digestive organs: Secondary | ICD-10-CM | POA: Diagnosis not present

## 2017-04-09 NOTE — Progress Notes (Signed)
   Keith Armstrong 51 y.o. [birth year] [id number] Referred by: Corwin LevinsJohn, James W, MD  Assessment & Plan:   Encounter Diagnosis  Name Primary?  . Family history of colon cancer Yes     A screening colonoscopy makes sense given his brother's history of rectal cancer in his early 4350s.The risks and benefits as well as alternatives of endoscopic procedure(s) have been discussed and reviewed. All questions answered. The patient agrees to proceed.  I appreciate the opportunity to care for this patient. CC: Corwin LevinsJohn, James W, MD  Subjective:   Chief Complaint: Consider screening colonoscopy  HPI Patient presents, wondering about having a preventive colonoscopy.  In discussion it turns out his brother was diagnosed with rectal cancer somewhat recently, he is in his early 10050s.  The patient himself has no active symptoms.  It sounds like his brother is getting neoadjuvant chemoradiation and then will need to have surgery.  No other family history of colon cancer though there is prostate cancer in some family. Allergies  Allergen Reactions  . Bee Venom Anaphylaxis   Current Meds  Medication Sig  . Clobetasol Prop Emollient Base 0.05 % emollient cream Use to affected areas twice daily and PRN  . sildenafil (VIAGRA) 100 MG tablet Take 100 mg by mouth as needed for erectile dysfunction.   Past Medical History:  Diagnosis Date  . Hepatitis C   . Laryngopharyngeal reflux (LPR)   . Psoriasis   . STD (sexually transmitted disease)   . Tinnitus    Past Surgical History:  Procedure Laterality Date  . INGUINAL HERNIA REPAIR Bilateral    and distended testicle   Social History   Social History Narrative   The patient is single.  No children.   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   Prior smoker.  Denies alcohol caffeine drug use or tobacco.   04/09/17    family history includes  Cancer in his brother; Diabetes in his maternal uncle; Heart attack in his maternal grandmother; Heart disease in his maternal grandmother; Hypertension in his mother; Kidney failure in his maternal uncle; Lung cancer in his mother; Other in his father.   Review of Systems As per HPI.  All other review of systems are negative.  Objective:   Physical Exam BP 112/62 (BP Location: Left Arm, Patient Position: Sitting, Cuff Size: Normal)   Pulse 64   Ht 5' 9.75" (1.772 m) Comment: height measured without shoes  Wt 186 lb 2 oz (84.4 kg)   BMI 26.90 kg/m  NAD   20 minutes time spent with patient > half in counseling coordination of care

## 2017-04-09 NOTE — Patient Instructions (Addendum)
You have been scheduled for a colonoscopy. Please follow written instructions given to you at your visit today.  Please pick up your prep supplies at the pharmacy. If you use inhalers (even only as needed), please bring them with you on the day of your procedure. Your physician has requested that you go to www.startemmi.com and enter the access code given to you at your visit today. This web site gives a general overview about your procedure. However, you should still follow specific instructions given to you by our office regarding your preparation for the procedure.  You may have a light breakfast the morning of prep day (the day before the procedure).   You may choose from one of the following items: eggs and toast OR chicken noodle soup and crackers.   You should have your breakfast completed between 8:00 and 9:00 am the day before your procedure.    After you have had your light breakfast you should start a clear liquid diet only, NO SOLIDS. No additional solid food is allowed. You may continue to have clear liquid up to 3 hours prior to your procedure.    I appreciate the opportunity to care for you. Stan Headarl Gessner, MD, [facility]

## 2017-04-14 ENCOUNTER — Encounter: Payer: Self-pay | Admitting: Internal Medicine

## 2017-05-21 ENCOUNTER — Encounter: Payer: Self-pay | Admitting: Internal Medicine

## 2017-05-26 ENCOUNTER — Telehealth: Payer: Self-pay | Admitting: Internal Medicine

## 2017-05-26 NOTE — Telephone Encounter (Signed)
OK to change to the code you listed

## 2017-05-26 NOTE — Telephone Encounter (Signed)
If his brother was adopted then it should be a routine colon cancer screening exam and NOT for family history

## 2017-05-26 NOTE — Telephone Encounter (Signed)
Yes Sir, if it's ok with you I will change the code to Z12.11 screening.  He understands it will fall under his deductible.  Thank you!

## 2017-05-27 ENCOUNTER — Telehealth: Payer: Self-pay

## 2017-05-27 NOTE — Telephone Encounter (Signed)
Patient was called due to phone message. I just confirmed that he did want to cancel his procedure until he turns 52. Due to insurance purposes. Appointment was cancelled.   Keith DaneNancy Lopaka Karge, LPN

## 2017-05-27 NOTE — Telephone Encounter (Signed)
Dx code changed to Z12.11 per Dr. Leone PayorGessner.

## 2017-05-27 NOTE — Telephone Encounter (Signed)
ok 

## 2017-05-27 NOTE — Telephone Encounter (Signed)
Patient called today wanting to cancel colonoscopy on 05/28/17 and states he will reschedule when he is 52 due to insurance.

## 2017-05-28 ENCOUNTER — Encounter: Payer: 59 | Admitting: Internal Medicine

## 2017-08-14 DIAGNOSIS — M79645 Pain in left finger(s): Secondary | ICD-10-CM | POA: Diagnosis not present

## 2017-09-26 DIAGNOSIS — M65342 Trigger finger, left ring finger: Secondary | ICD-10-CM | POA: Diagnosis not present

## 2018-02-09 DIAGNOSIS — M65342 Trigger finger, left ring finger: Secondary | ICD-10-CM | POA: Diagnosis not present

## 2018-03-05 ENCOUNTER — Encounter: Payer: 59 | Admitting: Internal Medicine

## 2018-03-05 DIAGNOSIS — Z0289 Encounter for other administrative examinations: Secondary | ICD-10-CM

## 2018-05-26 ENCOUNTER — Telehealth: Payer: Self-pay

## 2018-05-26 DIAGNOSIS — Z0000 Encounter for general adult medical examination without abnormal findings: Secondary | ICD-10-CM

## 2018-05-26 NOTE — Addendum Note (Signed)
Addended by: Corwin Levins on: 05/26/2018 09:31 PM   Modules accepted: Orders

## 2018-05-26 NOTE — Telephone Encounter (Signed)
Ok this is done 

## 2018-05-26 NOTE — Telephone Encounter (Signed)
Copied from CRM [id number]. Topic: Referral - Request for Referral >> May 26, 2018  3:41 PM Wyonia HoughJohnson, Chaz E wrote: Has patient seen PCP for this complaint? Yes (and appt scheduled With Dr. Jonny RuizJohn for Friday 01.10.2020)  Referral for which specialty: Colonoscopy  Preferred provider/office: Reason for referral: 52 yr old/ Time for a colonoscopy

## 2018-05-29 ENCOUNTER — Other Ambulatory Visit (INDEPENDENT_AMBULATORY_CARE_PROVIDER_SITE_OTHER): Payer: 59

## 2018-05-29 ENCOUNTER — Encounter: Payer: Self-pay | Admitting: Internal Medicine

## 2018-05-29 ENCOUNTER — Ambulatory Visit (INDEPENDENT_AMBULATORY_CARE_PROVIDER_SITE_OTHER): Payer: 59 | Admitting: Internal Medicine

## 2018-05-29 VITALS — BP 112/70 | HR 60 | Temp 97.8°F | Ht 69.75 in | Wt 200.0 lb

## 2018-05-29 DIAGNOSIS — R739 Hyperglycemia, unspecified: Secondary | ICD-10-CM

## 2018-05-29 DIAGNOSIS — E785 Hyperlipidemia, unspecified: Secondary | ICD-10-CM | POA: Insufficient documentation

## 2018-05-29 DIAGNOSIS — Z0000 Encounter for general adult medical examination without abnormal findings: Secondary | ICD-10-CM

## 2018-05-29 HISTORY — DX: Hyperlipidemia, unspecified: E78.5

## 2018-05-29 LAB — URINALYSIS, ROUTINE W REFLEX MICROSCOPIC
Bilirubin Urine: NEGATIVE
Hgb urine dipstick: NEGATIVE
Ketones, ur: NEGATIVE
Leukocytes, UA: NEGATIVE
Nitrite: NEGATIVE
Specific Gravity, Urine: 1.01 (ref 1.000–1.030)
TOTAL PROTEIN, URINE-UPE24: NEGATIVE
Urine Glucose: NEGATIVE
Urobilinogen, UA: 0.2 (ref 0.0–1.0)
pH: 8 (ref 5.0–8.0)

## 2018-05-29 LAB — HEPATIC FUNCTION PANEL
ALT: 14 U/L (ref 0–53)
AST: 15 U/L (ref 0–37)
Albumin: 4.6 g/dL (ref 3.5–5.2)
Alkaline Phosphatase: 57 U/L (ref 39–117)
Bilirubin, Direct: 0.1 mg/dL (ref 0.0–0.3)
Total Bilirubin: 0.6 mg/dL (ref 0.2–1.2)
Total Protein: 7.3 g/dL (ref 6.0–8.3)

## 2018-05-29 LAB — BASIC METABOLIC PANEL
BUN: 10 mg/dL (ref 6–23)
CO2: 27 mEq/L (ref 19–32)
Calcium: 9.4 mg/dL (ref 8.4–10.5)
Chloride: 103 mEq/L (ref 96–112)
Creatinine, Ser: 0.79 mg/dL (ref 0.40–1.50)
GFR: 110.28 mL/min (ref >60.00)
Glucose, Bld: 98 mg/dL (ref 70–99)
Potassium: 4.1 mEq/L (ref 3.5–5.1)
Sodium: 138 mEq/L (ref 135–145)

## 2018-05-29 LAB — LIPID PANEL
CHOLESTEROL: 235 mg/dL — AB (ref 0–200)
HDL: 57 mg/dL (ref >39.00)
LDL CALC: 158 mg/dL — AB (ref 0–99)
NonHDL: 178.12
Total CHOL/HDL Ratio: 4
Triglycerides: 100 mg/dL (ref 0.0–149.0)
VLDL: 20 mg/dL (ref 0.0–40.0)

## 2018-05-29 LAB — CBC WITH DIFFERENTIAL/PLATELET
Basophils Absolute: 0.1 10*3/uL (ref 0.0–0.1)
Basophils Relative: 1.3 % (ref 0.0–3.0)
Eosinophils Absolute: 0.3 10*3/uL (ref 0.0–0.7)
Eosinophils Relative: 4 % (ref 0.0–5.0)
HEMATOCRIT: 46.8 % (ref 39.0–52.0)
Hemoglobin: 15.8 g/dL (ref 13.0–17.0)
Lymphocytes Relative: 33.7 % (ref 12.0–46.0)
Lymphs Abs: 2.2 10*3/uL (ref 0.7–4.0)
MCHC: 33.8 g/dL (ref 30.0–36.0)
MCV: 95.5 fl (ref 78.0–100.0)
Monocytes Absolute: 0.7 10*3/uL (ref 0.1–1.0)
Monocytes Relative: 10 % (ref 3.0–12.0)
NEUTROS ABS: 3.4 10*3/uL (ref 1.4–7.7)
Neutrophils Relative %: 51 % (ref 43.0–77.0)
PLATELETS: 340 10*3/uL (ref 150.0–400.0)
RBC: 4.9 Mil/uL (ref 4.22–5.81)
RDW: 13.2 % (ref 11.5–15.5)
WBC: 6.7 10*3/uL (ref 4.0–10.5)

## 2018-05-29 LAB — PSA: PSA: 0.7 ng/mL (ref 0.10–4.00)

## 2018-05-29 LAB — HEMOGLOBIN A1C: Hgb A1c MFr Bld: 5.8 % (ref 4.6–6.5)

## 2018-05-29 LAB — TSH: TSH: 1.72 u[IU]/mL (ref 0.35–4.50)

## 2018-05-29 NOTE — Progress Notes (Signed)
Subjective:    Patient ID: Keith Armstrong, male    DOB: Oct 24, 1965, 53 y.o.   MRN: [id number]  HPI  Here for wellness and f/u;  Overall doing ok;  Pt denies Chest pain, worsening SOB, DOE, wheezing, orthopnea, PND, worsening LE edema, palpitations, dizziness or syncope.  Pt denies neurological change such as new headache, facial or extremity weakness.  Pt denies polydipsia, polyuria, or low sugar symptoms. Pt states overall good compliance with treatment and medications, good tolerability, and has been trying to follow appropriate diet.  Pt denies worsening depressive symptoms, suicidal ideation or panic. No fever, night sweats, wt loss, loss of appetite, or other constitutional symptoms.  Pt states good ability with ADL's, has low fall risk, home safety reviewed and adequate, no other significant changes in hearing or vision, and only occasionally active with exercise.  Has gained a few lbs. But really diligent about low chol diet per pt, does not want statin for now.  No new complaints Past Medical History:  Diagnosis Date  . Hepatitis C   . HLD (hyperlipidemia) 05/29/2018  . Laryngopharyngeal reflux (LPR)   . Psoriasis   . STD (sexually transmitted disease)   . Tinnitus    Past Surgical History:  Procedure Laterality Date  . INGUINAL HERNIA REPAIR Bilateral    and distended testicle    reports that he quit smoking about 11 years ago. He has a 20.00 pack-year smoking history. He has never used smokeless tobacco. He reports that he does not drink alcohol or use drugs. family history includes Cancer in his brother; Diabetes in his maternal uncle; Heart attack in his maternal grandmother; Heart disease in his maternal grandmother; Hypertension in his mother; Kidney failure in his maternal uncle; Lung cancer in his mother; Other in his father. Allergies  Allergen Reactions  . Bee Venom Anaphylaxis   Current Outpatient Medications on File Prior to Visit  Medication Sig Dispense Refill  .  Clobetasol Prop Emollient Base 0.05 % emollient cream Use to affected areas twice daily and PRN 30 g 1  . sildenafil (VIAGRA) 100 MG tablet Take 100 mg by mouth as needed for erectile dysfunction.     No current facility-administered medications on file prior to visit.    Review of Systems Constitutional: Negative for other unusual diaphoresis, sweats, appetite or weight changes HENT: Negative for other worsening hearing loss, ear pain, facial swelling, mouth sores or neck stiffness.   Eyes: Negative for other worsening pain, redness or other visual disturbance.  Respiratory: Negative for other stridor or swelling Cardiovascular: Negative for other palpitations or other chest pain  Gastrointestinal: Negative for worsening diarrhea or loose stools, blood in stool, distention or other pain Genitourinary: Negative for hematuria, flank pain or other change in urine volume.  Musculoskeletal: Negative for myalgias or other joint swelling.  Skin: Negative for other color change, or other wound or worsening drainage.  Neurological: Negative for other syncope or numbness. Hematological: Negative for other adenopathy or swelling Psychiatric/Behavioral: Negative for hallucinations, other worsening agitation, SI, self-injury, or new decreased concentration All other system neg per pt    Objective:   Physical Exam BP 112/70   Pulse 60   Temp 97.8 F (36.6 C) (Oral)   Ht 5' 9.75" (1.772 m)   Wt 200 lb (90.7 kg)   SpO2 97%   BMI 28.90 kg/m  VS noted,  Constitutional: Pt is oriented to person, place, and time. Appears well-developed and well-nourished, in no significant distress and comfortable Head:  Normocephalic and atraumatic  Eyes: Conjunctivae and EOM are normal. Pupils are equal, round, and reactive to light Right Ear: External ear normal without discharge Left Ear: External ear normal without discharge Nose: Nose without discharge or deformity Mouth/Throat: Oropharynx is without other  ulcerations and moist  Neck: Normal range of motion. Neck supple. No JVD present. No tracheal deviation present or significant neck LA or mass Cardiovascular: Normal rate, regular rhythm, normal heart sounds and intact distal pulses.   Pulmonary/Chest: WOB normal and breath sounds without rales or wheezing  Abdominal: Soft. Bowel sounds are normal. NT. No HSM  Musculoskeletal: Normal range of motion. Exhibits no edema Lymphadenopathy: Has no other cervical adenopathy.  Neurological: Pt is alert and oriented to person, place, and time. Pt has normal reflexes. No cranial nerve deficit. Motor grossly intact, Gait intact Skin: Skin is warm and dry. No rash noted or new ulcerations Psychiatric:  Has normal mood and affect. Behavior is normal without agitation No other exam findings Lab Results  Component Value Date   WBC 6.7 05/29/2018   HGB 15.8 05/29/2018   HCT 46.8 05/29/2018   PLT 340.0 05/29/2018   GLUCOSE 98 05/29/2018   CHOL 235 (H) 05/29/2018   TRIG 100.0 05/29/2018   HDL 57.00 05/29/2018   LDLCALC 158 (H) 05/29/2018   ALT 14 05/29/2018   AST 15 05/29/2018   NA 138 05/29/2018   K 4.1 05/29/2018   CL 103 05/29/2018   CREATININE 0.79 05/29/2018   BUN 10 05/29/2018   CO2 27 05/29/2018   TSH 1.72 05/29/2018   PSA 0.70 05/29/2018   INR 1.0 03/26/2016   HGBA1C 5.8 05/29/2018       Assessment & Plan:

## 2018-05-29 NOTE — Assessment & Plan Note (Signed)

## 2018-05-29 NOTE — Assessment & Plan Note (Signed)
stable overall by history and exam, recent data reviewed with pt, and pt to continue medical treatment as before,  to f/u any worsening symptoms or concerns  

## 2018-05-29 NOTE — Patient Instructions (Signed)
Please continue all other medications as before, and refills have been done if requested.  Please have the pharmacy call with any other refills you may need.  Please continue your efforts at being more active, low cholesterol diet, and weight control.  You are otherwise up to date with prevention measures today.  Please keep your appointments with your specialists as you may have planned  You will be contacted regarding the referral for: Colonoscopy  Please go to the LAB in the Basement (turn left off the elevator) for the tests to be done today  You will be contacted by phone if any changes need to be made immediately.  Otherwise, you will receive a letter about your results with an explanation, but please check with MyChart first.  Please remember to sign up for MyChart if you have not done so, as this will be important to you in the future with finding out test results, communicating by private email, and scheduling acute appointments online when needed.  Please return in 1 year for your yearly visit, or sooner if needed, with Lab testing done 3-5 days before

## 2018-05-29 NOTE — Assessment & Plan Note (Signed)
For low chol diet, declines statin 

## 2018-06-08 ENCOUNTER — Encounter: Payer: Self-pay | Admitting: Internal Medicine

## 2018-06-17 ENCOUNTER — Ambulatory Visit (AMBULATORY_SURGERY_CENTER): Payer: Self-pay | Admitting: *Deleted

## 2018-06-17 VITALS — Ht 70.0 in | Wt 194.0 lb

## 2018-06-17 DIAGNOSIS — Z1211 Encounter for screening for malignant neoplasm of colon: Secondary | ICD-10-CM

## 2018-06-17 NOTE — Progress Notes (Signed)
Patient denies any allergies to eggs or soy. Patient denies any problems with anesthesia/sedation. Patient denies any oxygen use at home. Patient denies taking any diet/weight loss medications or blood thinners. EMMI education offered, pt declined. Pt states he has an "adopted brother with colon cancer" but no blood relatives with colon cancer.

## 2018-06-19 ENCOUNTER — Encounter: Payer: Self-pay | Admitting: Internal Medicine

## 2018-07-01 ENCOUNTER — Encounter: Payer: 59 | Admitting: Internal Medicine

## 2018-07-03 ENCOUNTER — Ambulatory Visit (AMBULATORY_SURGERY_CENTER): Payer: 59 | Admitting: Internal Medicine

## 2018-07-03 ENCOUNTER — Encounter: Payer: Self-pay | Admitting: Internal Medicine

## 2018-07-03 VITALS — BP 114/85 | HR 65 | Temp 98.2°F | Resp 15 | Ht 70.0 in | Wt 194.0 lb

## 2018-07-03 DIAGNOSIS — Z1211 Encounter for screening for malignant neoplasm of colon: Secondary | ICD-10-CM | POA: Diagnosis not present

## 2018-07-03 MED ORDER — SODIUM CHLORIDE 0.9 % IV SOLN: 500.0000 mL | Freq: Once | INTRAVENOUS | Status: DC

## 2018-07-03 NOTE — Progress Notes (Signed)
To PACU, VSS. Report to RN.tb 

## 2018-07-03 NOTE — Progress Notes (Signed)
Pt's states no medical or surgical changes since previsit or office visit. 

## 2018-07-03 NOTE — Patient Instructions (Addendum)
   The colonoscopy was normal.  Next routine colonoscopy or other screening test in 10 years - 2030.  I appreciate the opportunity to care for you.  Iva Boop, MD, FACG   YOU HAD AN ENDOSCOPIC PROCEDURE TODAY AT THE [facility]:   Refer to the procedure report that was given to you for any specific questions about what was found during the examination.  If the procedure report does not answer your questions, please call your gastroenterologist to clarify.  If you requested that your care partner not be given the details of your procedure findings, then the procedure report has been included in a sealed envelope for you to review at your convenience later.  YOU SHOULD EXPECT: Some feelings of bloating in the abdomen. Passage of more gas than usual.  Walking can help get rid of the air that was put into your GI tract during the procedure and reduce the bloating. If you had a lower endoscopy (such as a colonoscopy or flexible sigmoidoscopy) you may notice spotting of blood in your stool or on the toilet paper. If you underwent a bowel prep for your procedure, you may not have a normal bowel movement for a few days.  Please Note:  You might notice some irritation and congestion in your nose or some drainage.  This is from the oxygen used during your procedure.  There is no need for concern and it should clear up in a day or so.  SYMPTOMS TO REPORT IMMEDIATELY:   Following lower endoscopy (colonoscopy or flexible sigmoidoscopy):  Excessive amounts of blood in the stool  Significant tenderness or worsening of abdominal pains  Swelling of the abdomen that is new, acute  Fever of 100F or higher    For urgent or emergent issues, a gastroenterologist can be reached at any hour by calling [phone].   DIET:  We do recommend a small meal at first, but then you may proceed to your regular diet.  Drink plenty of fluids but you should avoid alcoholic beverages for 24  hours.  ACTIVITY:  You should plan to take it easy for the rest of today and you should NOT DRIVE or use heavy machinery until tomorrow (because of the sedation medicines used during the test).    FOLLOW UP: Our staff will call the number listed on your records the next business day following your procedure to check on you and address any questions or concerns that you may have regarding the information given to you following your procedure. If we do not reach you, we will leave a message.  However, if you are feeling well and you are not experiencing any problems, there is no need to return our call.  We will assume that you have returned to your regular daily activities without incident.  If any biopsies were taken you will be contacted by phone or by letter within the next 1-3 weeks.  Please call us at [phone] if you have not heard about the biopsies in 3 weeks.    SIGNATURES/CONFIDENTIALITY: You and/or your care partner have signed paperwork which will be entered into your electronic medical record.  These signatures attest to the fact that that the information above on your After Visit Summary has been reviewed and is understood.  Full responsibility of the confidentiality of this discharge information lies with you and/or your care-partner.

## 2018-07-03 NOTE — Op Note (Signed)
[facility] Patient Name: Keith Armstrong Procedure Date: 07/03/2018 1:55 PM MRN: [id number] Endoscopist: Iva Boop , MD Age: 53 Referring MD:  Date of Birth: 1966/01/11 Gender: Male Account #: [id number] Procedure:                Colonoscopy Indications:              Screening for colorectal malignant neoplasm, This                            is the patient's first colonoscopy Medicines:                Propofol per Anesthesia, Monitored Anesthesia Care Procedure:                Pre-Anesthesia Assessment:                           - Prior to the procedure, a History and Physical                            was performed, and patient medications and                            allergies were reviewed. The patient's tolerance of                            previous anesthesia was also reviewed. The risks                            and benefits of the procedure and the sedation                            options and risks were discussed with the patient.                            All questions were answered, and informed consent                            was obtained. Prior Anticoagulants: The patient has                            taken no previous anticoagulant or antiplatelet                            agents. ASA Grade Assessment: II - A patient with                            mild systemic disease. After reviewing the risks                            and benefits, the patient was deemed in                            satisfactory condition to undergo the procedure.  After obtaining informed consent, the colonoscope                            was passed under direct vision. Throughout the                            procedure, the patient's blood pressure, pulse, and                            oxygen saturations were monitored continuously. The                            Colonoscope was introduced through the anus and   advanced to the the cecum, identified by                            appendiceal orifice and ileocecal valve. The                            colonoscopy was performed without difficulty. The                            patient tolerated the procedure well. The quality                            of the bowel preparation was excellent. The                            ileocecal valve, appendiceal orifice, and rectum                            were photographed. Scope In: 2:02:45 PM Scope Out: 2:13:09 PM Scope Withdrawal Time: 0 hours 8 minutes 41 seconds  Total Procedure Duration: 0 hours 10 minutes 24 seconds  Findings:                 The perianal and digital rectal examinations were                            normal. Pertinent negatives include normal prostate                            (size, shape, and consistency).                           The entire examined colon appeared normal on direct                            and retroflexion views. Complications:            No immediate complications. Estimated Blood Loss:     Estimated blood loss: none. Impression:               - The entire examined colon is normal on direct and  retroflexion views.                           - No specimens collected. Recommendation:           - Patient has a contact number available for                            emergencies. The signs and symptoms of potential                            delayed complications were discussed with the                            patient. Return to normal activities tomorrow.                            Written discharge instructions were provided to the                            patient.                           - Resume previous diet.                           - Continue present medications.                           - Repeat colonoscopy in 10 years for screening                            purposes. Iva Booparl E Gessner, MD 07/03/2018 2:22:16 PM This  report has been signed electronically.

## 2018-07-06 ENCOUNTER — Telehealth: Payer: Self-pay

## 2018-07-06 NOTE — Telephone Encounter (Signed)
  Follow up Call-  Call back number 07/03/2018  Post procedure Call Back phone  # [phone]  Permission to leave phone message Yes  Some recent data might be hidden    Left message

## 2018-07-06 NOTE — Telephone Encounter (Signed)
Follow up call made, name identifier, left a message.

## 2018-07-22 ENCOUNTER — Encounter (HOSPITAL_BASED_OUTPATIENT_CLINIC_OR_DEPARTMENT_OTHER): Payer: Self-pay | Admitting: *Deleted

## 2018-07-22 ENCOUNTER — Other Ambulatory Visit: Payer: Self-pay

## 2018-07-27 ENCOUNTER — Other Ambulatory Visit: Payer: Self-pay | Admitting: Orthopedic Surgery

## 2018-07-30 ENCOUNTER — Encounter (HOSPITAL_BASED_OUTPATIENT_CLINIC_OR_DEPARTMENT_OTHER): Payer: Self-pay | Admitting: *Deleted

## 2018-07-30 ENCOUNTER — Ambulatory Visit (HOSPITAL_BASED_OUTPATIENT_CLINIC_OR_DEPARTMENT_OTHER): Payer: 59 | Admitting: Anesthesiology

## 2018-07-30 ENCOUNTER — Encounter (HOSPITAL_BASED_OUTPATIENT_CLINIC_OR_DEPARTMENT_OTHER)
Admission: RE | Disposition: A | Discharge status: Home / Self Care | Payer: Self-pay | Source: Home / Self Care | Attending: Orthopedic Surgery

## 2018-07-30 ENCOUNTER — Other Ambulatory Visit: Payer: Self-pay

## 2018-07-30 ENCOUNTER — Ambulatory Visit (HOSPITAL_BASED_OUTPATIENT_CLINIC_OR_DEPARTMENT_OTHER)
Admission: RE | Admit: 2018-07-30 | Discharge: 2018-07-30 | Disposition: A | Discharge status: Home / Self Care | Payer: 59 | Attending: Orthopedic Surgery | Admitting: Orthopedic Surgery

## 2018-07-30 DIAGNOSIS — Z87891 Personal history of nicotine dependence: Secondary | ICD-10-CM | POA: Insufficient documentation

## 2018-07-30 DIAGNOSIS — B1920 Unspecified viral hepatitis C without hepatic coma: Secondary | ICD-10-CM | POA: Insufficient documentation

## 2018-07-30 DIAGNOSIS — E785 Hyperlipidemia, unspecified: Secondary | ICD-10-CM | POA: Diagnosis not present

## 2018-07-30 DIAGNOSIS — M65342 Trigger finger, left ring finger: Secondary | ICD-10-CM | POA: Diagnosis not present

## 2018-07-30 DIAGNOSIS — Z91030 Bee allergy status: Secondary | ICD-10-CM | POA: Insufficient documentation

## 2018-07-30 DIAGNOSIS — M65842 Other synovitis and tenosynovitis, left hand: Secondary | ICD-10-CM | POA: Diagnosis present

## 2018-07-30 DIAGNOSIS — Z87892 Personal history of anaphylaxis: Secondary | ICD-10-CM | POA: Insufficient documentation

## 2018-07-30 HISTORY — PX: TRIGGER FINGER RELEASE: SHX641

## 2018-07-30 HISTORY — DX: Gastro-esophageal reflux disease without esophagitis: K21.9

## 2018-07-30 SURGERY — RELEASE, A1 PULLEY, FOR TRIGGER FINGER
Anesthesia: Monitor Anesthesia Care | Site: Hand | Laterality: Left

## 2018-07-30 MED ORDER — OXYCODONE HCL 5 MG PO TABS: 5.0000 mg | ORAL_TABLET | Freq: Once | ORAL | Status: DC | PRN

## 2018-07-30 MED ORDER — FENTANYL CITRATE (PF) 100 MCG/2ML IJ SOLN
INTRAMUSCULAR | Status: AC
  Filled 2018-07-30: qty 2

## 2018-07-30 MED ORDER — FENTANYL CITRATE (PF) 100 MCG/2ML IJ SOLN: 25.0000 ug | INTRAMUSCULAR | Status: DC | PRN

## 2018-07-30 MED ORDER — LIDOCAINE HCL (PF) 0.5 % IJ SOLN
INTRAMUSCULAR | Status: DC | PRN
  Administered 2018-07-30: 35 mL via INTRAVENOUS

## 2018-07-30 MED ORDER — CHLORHEXIDINE GLUCONATE 4 % EX LIQD: 60.0000 mL | Freq: Once | CUTANEOUS | Status: DC

## 2018-07-30 MED ORDER — CEFAZOLIN SODIUM-DEXTROSE 2-4 GM/100ML-% IV SOLN
INTRAVENOUS | Status: AC
  Filled 2018-07-30: qty 100

## 2018-07-30 MED ORDER — ONDANSETRON HCL 4 MG/2ML IJ SOLN
INTRAMUSCULAR | Status: DC | PRN
  Administered 2018-07-30: 4 mg via INTRAVENOUS

## 2018-07-30 MED ORDER — LACTATED RINGERS IV SOLN
INTRAVENOUS | Status: DC
  Administered 2018-07-30: 11:00:00 via INTRAVENOUS

## 2018-07-30 MED ORDER — OXYCODONE HCL 5 MG/5ML PO SOLN: 5.0000 mg | Freq: Once | ORAL | Status: DC | PRN

## 2018-07-30 MED ORDER — ONDANSETRON HCL 4 MG/2ML IJ SOLN: 4.0000 mg | Freq: Once | INTRAMUSCULAR | Status: DC | PRN

## 2018-07-30 MED ORDER — CEFAZOLIN SODIUM-DEXTROSE 2-4 GM/100ML-% IV SOLN: 2.0000 g | INTRAVENOUS | Status: DC

## 2018-07-30 MED ORDER — MIDAZOLAM HCL 2 MG/2ML IJ SOLN
INTRAMUSCULAR | Status: AC
  Filled 2018-07-30: qty 2

## 2018-07-30 MED ORDER — MIDAZOLAM HCL 2 MG/2ML IJ SOLN: 1.0000 mg | INTRAMUSCULAR | Status: DC | PRN

## 2018-07-30 MED ORDER — SCOPOLAMINE 1 MG/3DAYS TD PT72: 1.0000 | MEDICATED_PATCH | Freq: Once | TRANSDERMAL | Status: DC | PRN

## 2018-07-30 MED ORDER — BUPIVACAINE HCL (PF) 0.25 % IJ SOLN
INTRAMUSCULAR | Status: DC | PRN
  Administered 2018-07-30: 7 mL

## 2018-07-30 MED ORDER — FENTANYL CITRATE (PF) 100 MCG/2ML IJ SOLN: 50.0000 ug | INTRAMUSCULAR | Status: DC | PRN

## 2018-07-30 MED ORDER — TRAMADOL HCL 50 MG PO TABS: 50.0000 mg | ORAL_TABLET | Freq: Four times a day (QID) | ORAL | 0 refills | Status: DC | PRN

## 2018-07-30 SURGICAL SUPPLY — 31 items
BLADE SURG 15 STRL LF DISP TIS (BLADE) ×1 IMPLANT
BLADE SURG 15 STRL SS (BLADE) ×1
BNDG COHESIVE 2X5 TAN STRL LF (GAUZE/BANDAGES/DRESSINGS) ×2 IMPLANT
BNDG ESMARK 4X9 LF (GAUZE/BANDAGES/DRESSINGS) ×2 IMPLANT
CHLORAPREP W/TINT 26 (MISCELLANEOUS) ×2 IMPLANT
CORD BIPOLAR FORCEPS 12FT (ELECTRODE) IMPLANT
COVER BACK TABLE 60X90IN (DRAPES) ×2 IMPLANT
COVER MAYO STAND STRL (DRAPES) ×2 IMPLANT
COVER WAND RF STERILE (DRAPES) IMPLANT
CUFF TOURN SGL QUICK 18X4 (TOURNIQUET CUFF) ×2 IMPLANT
DECANTER SPIKE VIAL GLASS SM (MISCELLANEOUS) IMPLANT
DRAPE EXTREMITY T 121X128X90 (DISPOSABLE) ×2 IMPLANT
DRAPE SURG 17X23 STRL (DRAPES) ×2 IMPLANT
GAUZE SPONGE 4X4 12PLY STRL (GAUZE/BANDAGES/DRESSINGS) ×2 IMPLANT
GAUZE XEROFORM 1X8 LF (GAUZE/BANDAGES/DRESSINGS) ×2 IMPLANT
GLOVE BIO SURGEON STRL SZ 6.5 (GLOVE) ×4 IMPLANT
GLOVE BIOGEL PI IND STRL 8.5 (GLOVE) ×1 IMPLANT
GLOVE BIOGEL PI INDICATOR 8.5 (GLOVE) ×1
GLOVE SURG ORTHO 8.0 STRL STRW (GLOVE) ×2 IMPLANT
GOWN STRL REUS W/ TWL LRG LVL3 (GOWN DISPOSABLE) ×1 IMPLANT
GOWN STRL REUS W/TWL LRG LVL3 (GOWN DISPOSABLE) ×1
GOWN STRL REUS W/TWL XL LVL3 (GOWN DISPOSABLE) ×2 IMPLANT
NEEDLE PRECISIONGLIDE 27X1.5 (NEEDLE) ×2 IMPLANT
NS IRRIG 1000ML POUR BTL (IV SOLUTION) ×2 IMPLANT
PACK BASIN DAY SURGERY FS (CUSTOM PROCEDURE TRAY) ×2 IMPLANT
STOCKINETTE 4X48 STRL (DRAPES) ×2 IMPLANT
SUT ETHILON 4 0 PS 2 18 (SUTURE) ×2 IMPLANT
SYR BULB 3OZ (MISCELLANEOUS) ×2 IMPLANT
SYR CONTROL 10ML LL (SYRINGE) ×2 IMPLANT
TOWEL GREEN STERILE FF (TOWEL DISPOSABLE) ×4 IMPLANT
UNDERPAD 30X30 (UNDERPADS AND DIAPERS) ×2 IMPLANT

## 2018-07-30 NOTE — Discharge Instructions (Addendum)

## 2018-07-30 NOTE — Addendum Note (Signed)
Addendum  created 07/30/18 1336 by Lucretia Kern, MD   Attestation recorded in Intraprocedure, Intraprocedure Attestations filed

## 2018-07-30 NOTE — Op Note (Signed)
NAME: Keith Armstrong MEDICAL RECORD NO: [id number] DATE OF BIRTH: 06-Mar-1966 FACILITY: [facility] LOCATION: [facility] PHYSICIAN: Nicki Reaper, MD   OPERATIVE REPORT   DATE OF PROCEDURE: 07/30/18    PREOPERATIVE DIAGNOSIS:   Stenosing tenosynovitis left ring finger   POSTOPERATIVE DIAGNOSIS:   Same   PROCEDURE:   Release A1 pulley left ring finger   SURGEON: Cindee Salt, M.D.   ASSISTANT: none   ANESTHESIA:  Bier block with sedation and Local   INTRAVENOUS FLUIDS:  Per anesthesia flow sheet.   ESTIMATED BLOOD LOSS:  Minimal.   COMPLICATIONS:  None.   SPECIMENS:  none   TOURNIQUET TIME:    Total Tourniquet Time Documented: Forearm (Left) - 16 minutes Total: Forearm (Left) - 16 minutes    DISPOSITION:  Stable to PACU.   INDICATIONS: Patient is a 53 year old male with a history of triggering of his left ring finger.  This is not responded to conservative treatment.  He has elected undergo surgical release of the A1 pulley.  Pre-peri-and postoperative course been discussed along with risks and complications.  He is aware that there is no guarantee to the surgery the possibility of infection recurrence injury to arteries nerves tendons complete relief symptoms and dystrophy.  Preoperative area the patient is seen extremity marked by both patient and surgeon antibiotic given  OPERATIVE COURSE: Patient is brought to the operating room where a forearm-based IV regional anesthetic was carried out without difficulty under the direction of the anesthesia department.  Was prepped using ChloraPrep in the supine position with left arm free.  A three-minute dry time was allowed timeout taken to confirm patient procedure.  An oblique incision was made over the A1 pulley of the left ring finger carried down through subcutaneous tissue.  Bleeders were electrocauterized as necessary with bipolar.  Retractors were placed retracting neurovascular bundles radially and ulnarly.   The A1 pulley was identified found to be markedly thickened this was released on its radial aspect a small incision made centrally and A2 tenosynovium tissue proximally was separated the 2 tendons separated breaking any adhesions between the tenosynovial tissue.  Amount of fluid was present in the flexor sheaths proximally.  Finger placed through full range of motion no further triggering was noted.  The wound was irrigated with saline.  The skin was closed interrupted 4-0 nylon sutures.  Local infiltration quarter percent bupivacaine without epinephrine was good proximally 5 cc was used.  Sterile compressive dressing with fingers 3 was applied.  Inflation of the tourniquet all fingers immediately pink.  He was taken to the recovery room for observation in satisfactory condition.  He will be discharged home strict return to the hand center of [facility] in 1 week on Tylenol with ibuprofen and Ultram as a backup.   Cindee Salt, MD Electronically signed, 07/30/18

## 2018-07-30 NOTE — Anesthesia Postprocedure Evaluation (Signed)
Anesthesia Post Note  Patient: Keith Armstrong  Procedure(s) Performed: RELEASE TRIGGER FINGER/A-1 PULLEY (Left Hand)     Patient location during evaluation: PACU Anesthesia Type: MAC Level of consciousness: awake and alert Pain management: pain level controlled Vital Signs Assessment: post-procedure vital signs reviewed and stable Respiratory status: spontaneous breathing, nonlabored ventilation and respiratory function stable Cardiovascular status: blood pressure returned to baseline and stable Postop Assessment: no apparent nausea or vomiting Anesthetic complications: no    Last Vitals:  Vitals:   07/30/18 1107 07/30/18 1313  BP: 111/75 140/86  Pulse: (!) 58 (!) 49  Resp: 18 20  Temp: 36.6 C 36.8 C  SpO2: 98% 100%    Last Pain:  Vitals:   07/30/18 1313  TempSrc:   PainSc: 0-No pain                 Lidia Collum

## 2018-07-30 NOTE — Anesthesia Preprocedure Evaluation (Addendum)
Anesthesia Evaluation  Patient identified by MRN, date of birth, ID band Patient awake    Reviewed: Allergy & Precautions, NPO status , Patient's Chart, lab work & pertinent test results  History of Anesthesia Complications Negative for: history of anesthetic complications  Airway Mallampati: I  TM Distance: >3 FB Neck ROM: Full    Dental no notable dental hx.    Pulmonary neg pulmonary ROS, former smoker,    Pulmonary exam normal        Cardiovascular negative cardio ROS Normal cardiovascular exam     Neuro/Psych negative neurological ROS  negative psych ROS   GI/Hepatic GERD  ,(+) Hepatitis -, C  Endo/Other  negative endocrine ROS  Renal/GU negative Renal ROS  negative genitourinary   Musculoskeletal negative musculoskeletal ROS (+)   Abdominal   Peds  Hematology negative hematology ROS (+)   Anesthesia Other Findings   Reproductive/Obstetrics                            Anesthesia Physical Anesthesia Plan  ASA: II  Anesthesia Plan: MAC and Bier Block and Bier Block-LIDOCAINE ONLY   Post-op Pain Management:    Induction:   PONV Risk Score and Plan: 1 and Propofol infusion and Treatment may vary due to age or medical condition  Airway Management Planned: Nasal Cannula and Simple Face Mask  Additional Equipment: None  Intra-op Plan:   Post-operative Plan:   Informed Consent: I have reviewed the patients History and Physical, chart, labs and discussed the procedure including the risks, benefits and alternatives for the proposed anesthesia with the patient or authorized representative who has indicated his/her understanding and acceptance.       Plan Discussed with:   Anesthesia Plan Comments:        Anesthesia Quick Evaluation

## 2018-07-30 NOTE — H&P (Signed)
  Keith Armstrong is an 53 y.o. male.   Chief Complaint: Catching left ring finger HPI: Keith Armstrong is a 53 year old right-hand-dominant male referred by Dr. Farris Has for consultation regarding pain and stiffness catching of his left ring finger. This been going on for the past 2 to 3 weeks. He recalls no history of injury. He states it is worse in the morning and improves during the day. Complains of pain at the palm with stretching or hyper attempting to hyperextend his metacarpal phalangeal joint. He is not complain of any numbness or tingling. He has not had any treatment for this. He has no history of diabetes thyroid problems arthritis gout. Family history is negative for each of these also.  This is been injected on 2 occasions without relief.   Past Medical History:  Diagnosis Date  . GERD (gastroesophageal reflux disease)   . Hepatitis C    non reactive  . HLD (hyperlipidemia) 05/29/2018  . Laryngopharyngeal reflux (LPR)   . Psoriasis   . STD (sexually transmitted disease)   . Tinnitus     Past Surgical History:  Procedure Laterality Date  . COLONOSCOPY    . INGUINAL HERNIA REPAIR Bilateral    and distended testicle    Family History  Problem Relation Age of Onset  . Hypertension Mother   . Lung cancer Mother   . Cancer - Lung Mother   . Heart disease Maternal Grandmother   . Heart attack Maternal Grandmother   . Other Father        [place] spotted fever  . Cancer Brother        type unknown  . Diabetes Maternal Uncle   . Kidney failure Maternal Uncle   . Colon cancer Neg Hx   . Colon polyps Neg Hx   . Rectal cancer Neg Hx   . Stomach cancer Neg Hx   . Esophageal cancer Neg Hx    Social History:  reports that he quit smoking about 11 years ago. He has a 20.00 pack-year smoking history. He has never used smokeless tobacco. He reports previous alcohol use of about 4.0 standard drinks of alcohol per week. He reports previous drug use. Drugs: Cocaine and  Marijuana.  Allergies:  Allergies  Allergen Reactions  . Bee Venom Anaphylaxis    No medications prior to admission.    No results found for this or any previous visit (from the past 48 hour(s)).  No results found.   Pertinent items are noted in HPI.  Height 5\' 10"  (1.778 m), weight 90.7 kg.  General appearance: alert, cooperative and appears stated age Head: Normocephalic, without obvious abnormality Neck: no JVD Resp: clear to auscultation bilaterally Cardio: regular rate and rhythm, S1, S2 normal, no murmur, click, rub or gallop GI: soft, non-tender; bowel sounds normal; no masses,  no organomegaly Extremities: catching left ring finger Pulses: 2+ and symmetric Skin: Skin color, texture, turgor normal. No rashes or lesions Neurologic: Grossly normal Incision/Wound: na  Assessment/Plan Assessment: Stenosing tenosynovitis left ring finger  Plan: Release A1 pulley left ring finger.  Pre-peri-and postoperative course been discussed along with risks and complications.  He is aware there is no guarantee to the surgery the possibility of infection recurrence injury to arteries nerves tendons complete release symptoms dystrophy.  This scheduled as an outpatient under regional anesthesia.  Cindee Salt 07/30/2018, 10:30 AM

## 2018-07-30 NOTE — Transfer of Care (Signed)
Immediate Anesthesia Transfer of Care Note  Patient: Keith Armstrong  Procedure(s) Performed: RELEASE TRIGGER FINGER/A-1 PULLEY (Left Hand)  Patient Location: PACU  Anesthesia Type:Bier block  Level of Consciousness: awake, alert  and oriented  Airway & Oxygen Therapy: Patient Spontanous Breathing  Post-op Assessment: Report given to RN and Post -op Vital signs reviewed and stable  Post vital signs: Reviewed and stable  Last Vitals:  Vitals Value Taken Time  BP    Temp    Pulse    Resp    SpO2      Last Pain:  Vitals:   07/30/18 1107  TempSrc: Oral      Patients Stated Pain Goal: 0 (07/30/18 1107)  Complications: No apparent anesthesia complications

## 2018-07-30 NOTE — Brief Op Note (Signed)
07/30/2018  1:16 PM  PATIENT:  Keith Armstrong  53 y.o. male  PRE-OPERATIVE DIAGNOSIS:  LEFT RING FINGER TRIGGER  POST-OPERATIVE DIAGNOSIS:  LEFT RING FINGER TRIGGER  PROCEDURE:  Procedure(s): RELEASE TRIGGER FINGER/A-1 PULLEY (Left)  SURGEON:  Surgeon(s) and Role:    * Cindee Salt, MD - Primary  PHYSICIAN ASSISTANT:   ASSISTANTS: none   ANESTHESIA:   local, regional and IV sedation  EBL: 1 mL   BLOOD ADMINISTERED:none  DRAINS: none   LOCAL MEDICATIONS USED:  BUPIVICAINE   SPECIMEN:  No Specimen  DISPOSITION OF SPECIMEN:  N/A  COUNTS:  YES  TOURNIQUET:   Total Tourniquet Time Documented: Forearm (Left) - 16 minutes Total: Forearm (Left) - 16 minutes   DICTATION: .Keith Armstrong Dictation  PLAN OF CARE: Discharge to home after PACU  PATIENT DISPOSITION:  PACU - hemodynamically stable.

## 2018-07-31 ENCOUNTER — Encounter (HOSPITAL_BASED_OUTPATIENT_CLINIC_OR_DEPARTMENT_OTHER): Payer: Self-pay | Admitting: Orthopedic Surgery

## 2018-09-23 DIAGNOSIS — L409 Psoriasis, unspecified: Secondary | ICD-10-CM | POA: Diagnosis not present

## 2018-11-26 ENCOUNTER — Other Ambulatory Visit: Payer: Self-pay

## 2018-11-26 ENCOUNTER — Ambulatory Visit (INDEPENDENT_AMBULATORY_CARE_PROVIDER_SITE_OTHER): Payer: 59 | Admitting: Internal Medicine

## 2018-11-26 ENCOUNTER — Encounter: Payer: Self-pay | Admitting: Internal Medicine

## 2018-11-26 ENCOUNTER — Ambulatory Visit: Payer: Self-pay

## 2018-11-26 DIAGNOSIS — R739 Hyperglycemia, unspecified: Secondary | ICD-10-CM

## 2018-11-26 DIAGNOSIS — R509 Fever, unspecified: Secondary | ICD-10-CM | POA: Diagnosis not present

## 2018-11-26 NOTE — Telephone Encounter (Signed)
Ok, we will wait to see if patient still requires referral after he contacts the [facility]

## 2018-11-26 NOTE — Patient Instructions (Signed)
I would recommend you go to the ED now  If this is not acceptable to you, I will place the referral to the [place] for an appt for testing  Please continue all other medications as before, and refills have been done if requested.  Please have the pharmacy call with any other refills you may need.  Please keep your appointments with your specialists as you may have planned

## 2018-11-26 NOTE — Assessment & Plan Note (Signed)
C/w infectious illness, likely viral, cant r/o COVID, will refer for testing appt, as pt declines to go to ED given the admittedly mild but worsening sob/doe/wheezing.

## 2018-11-26 NOTE — Telephone Encounter (Signed)
Patient called and says for 5 days he's been feeling symptoms-feverish but did not check temperature and slight SOB with activity. He says yesterday he took Ibuprofen for feeling warm in his head amd had loss of appetite. He says today all that has resolved, but his breathing is not getting better and this is day 5. He says when he walks to the mailbox or does any light activity, he notices it's harder for him to breathe. He describes it as feeling like he's breathing shallow, can't take in as much air as he normally does. He says talking for any prolonged time he notices his breath is shorter. He says that he saw on the television that you can get tested for covid without a doctor referral. I advised he will need a virtual visit and then the provider will recommend him for testing and a nurse will call to schedule. He asks is there somewhere to go without having an order, I advised he could call the [facility] Department for information. I advised to go to an UC or do an e-visit tonight and they could refer him for testing and we would call him to schedule. He says he will take my advice and call the health department first, then go from there.   Answer Assessment - Initial Assessment Questions 1. COVID-19 DIAGNOSIS: "Who made your Coronavirus (COVID-19) diagnosis?" "Was it confirmed by a positive lab test?" If not diagnosed by a HCP, ask "Are there lots of cases (community spread) where you live?" (See public health department website, if unsure)     Not tested 2. ONSET: "When did the COVID-19 symptoms start?"      5 days ago 3. WORST SYMPTOM: "What is your worst symptom?" (e.g., cough, fever, shortness of breath, muscle aches)     Slight shortness of breath 4. COUGH: "Do you have a cough?" If so, ask: "How bad is the cough?"       No 5. FEVER: "Do you have a fever?" If so, ask: "What is your temperature, how was it measured, and when did it start?"     Not measured, felt feverish 6. RESPIRATORY  STATUS: "Describe your breathing?" (e.g., shortness of breath, wheezing, unable to speak)      With activity, slight SOB, feels like breathing shallow and can't hold a lot of air 7. BETTER-SAME-WORSE: "Are you getting better, staying the same or getting worse compared to yesterday?"  If getting worse, ask, "In what way?"     Worse for breathing 8. HIGH RISK DISEASE: "Do you have any chronic medical problems?" (e.g., asthma, heart or lung disease, weak immune system, etc.)     N/A 9. PREGNANCY: "Is there any chance you are pregnant?" "When was your last menstrual period?"     N/A 10. OTHER SYMPTOMS: "Do you have any other symptoms?"  (e.g., chills, fatigue, headache, loss of smell or taste, muscle pain, sore throat)      Loss of appetite, but now better.  Protocols used: CORONAVIRUS (COVID-19) DIAGNOSED OR SUSPECTED-A-AH

## 2018-11-26 NOTE — Assessment & Plan Note (Signed)
stable overall by history and exam, recent data reviewed with pt, and pt to continue medical treatment as before,  to f/u any worsening symptoms or concerns  

## 2018-11-26 NOTE — Progress Notes (Signed)
Patient ID: Keith Armstrong, male   DOB: [birth year], 53 y.o.   MRN: [id number]  Virtual Visit via Video Note  I connected with Ann Lions on 11/26/18 at  7:00 PM EDT by a video enabled telemedicine application and verified that I am speaking with the correct person using two identifiers.  Location: Patient: at home Provider: at office   I discussed the limitations of evaluation and management by telemedicine and the availability of in person appointments. The patient expressed understanding and agreed to proceed.  History of Present Illness: Here after attending a picnic on July 4, then with onset symptoms evolving and gradually worsening unusual fatigue, feeling feverish, lightheaded, lower appetite, early satiety, then onset yesterday of unusual sob/doe/wheeziness now even with walking back to the house from the mailbox to mild driveway incline.  Denies loss of taste or smell, and has only "small hacky cough".  Quit smoking x 15 yrs, no prior known hx of copd or exacerbation Past Medical History:  Diagnosis Date  . GERD (gastroesophageal reflux disease)   . Hepatitis C    non reactive  . HLD (hyperlipidemia) 05/29/2018  . Laryngopharyngeal reflux (LPR)   . Psoriasis   . STD (sexually transmitted disease)   . Tinnitus    Past Surgical History:  Procedure Laterality Date  . COLONOSCOPY    . INGUINAL HERNIA REPAIR Bilateral    and distended testicle  . TRIGGER FINGER RELEASE Left 07/30/2018   Procedure: RELEASE TRIGGER FINGER/A-1 PULLEY;  Surgeon: Daryll Brod, MD;  Location: [place];  Service: Orthopedics;  Laterality: Left;    reports that he quit smoking about 11 years ago. He has a 20.00 pack-year smoking history. He has never used smokeless tobacco. He reports previous alcohol use of about 4.0 standard drinks of alcohol per week. He reports previous drug use. Drugs: Cocaine and Marijuana. family history includes Cancer in his brother; Cancer - Lung in his  mother; Diabetes in his maternal uncle; Heart attack in his maternal grandmother; Heart disease in his maternal grandmother; Hypertension in his mother; Kidney failure in his maternal uncle; Lung cancer in his mother; Other in his father. Allergies  Allergen Reactions  . Bee Venom Anaphylaxis   Current Outpatient Medications on File Prior to Visit  Medication Sig Dispense Refill  . Clobetasol Prop Emollient Base 0.05 % emollient cream Use to affected areas twice daily and PRN 30 g 1  . traMADol (ULTRAM) 50 MG tablet Take 1 tablet (50 mg total) by mouth every 6 (six) hours as needed. 20 tablet 0   No current facility-administered medications on file prior to visit.     Observations/Objective: Alert, NAD, appropriate mood and affect, resps normal, cn 2-12 intact, moves all 4s, no visible rash or swelling Lab Results  Component Value Date   WBC 6.7 05/29/2018   HGB 15.8 05/29/2018   HCT 46.8 05/29/2018   PLT 340.0 05/29/2018   GLUCOSE 98 05/29/2018   CHOL 235 (H) 05/29/2018   TRIG 100.0 05/29/2018   HDL 57.00 05/29/2018   LDLCALC 158 (H) 05/29/2018   ALT 14 05/29/2018   AST 15 05/29/2018   NA 138 05/29/2018   K 4.1 05/29/2018   CL 103 05/29/2018   CREATININE 0.79 05/29/2018   BUN 10 05/29/2018   CO2 27 05/29/2018   TSH 1.72 05/29/2018   PSA 0.70 05/29/2018   INR 1.0 03/26/2016   HGBA1C 5.8 05/29/2018   Assessment and Plan: See notes  Follow Up Instructions: seenotes  I discussed the assessment and treatment plan with the patient. The patient was provided an opportunity to ask questions and all were answered. The patient agreed with the plan and demonstrated an understanding of the instructions.   The patient was advised to call back or seek an in-person evaluation if the symptoms worsen or if the condition fails to improve as anticipated.  Cathlean Cower, MD

## 2018-11-27 ENCOUNTER — Telehealth: Payer: Self-pay | Admitting: *Deleted

## 2018-11-27 DIAGNOSIS — Z20822 Contact with and (suspected) exposure to covid-19: Secondary | ICD-10-CM

## 2018-11-27 NOTE — Telephone Encounter (Signed)
-----   Message from Biagio Borg, MD sent at 11/26/2018  7:18 PM EDT ----- Regarding: covid testing Please contact pt for COVID testing appointment.    Thanks Cathlean Cower MD

## 2018-11-27 NOTE — Telephone Encounter (Signed)
Pt scheduled for covid-19 testing 11/30/18 @ [facility] @8 :45 Instructions given and order placed.

## 2018-11-30 ENCOUNTER — Other Ambulatory Visit: Payer: Self-pay

## 2018-11-30 DIAGNOSIS — Z20822 Contact with and (suspected) exposure to covid-19: Secondary | ICD-10-CM

## 2018-12-04 LAB — NOVEL CORONAVIRUS, NAA: SARS-CoV-2, NAA: NOT DETECTED

## 2018-12-08 ENCOUNTER — Encounter: Payer: Self-pay | Admitting: Internal Medicine

## 2018-12-08 DIAGNOSIS — Z20822 Contact with and (suspected) exposure to covid-19: Secondary | ICD-10-CM

## 2018-12-08 DIAGNOSIS — Z20828 Contact with and (suspected) exposure to other viral communicable diseases: Secondary | ICD-10-CM

## 2018-12-09 NOTE — Telephone Encounter (Signed)
Given the accuracy rate and cost.  Patient has decided not to have this done.

## 2019-04-05 ENCOUNTER — Ambulatory Visit (INDEPENDENT_AMBULATORY_CARE_PROVIDER_SITE_OTHER): Payer: 59 | Admitting: Podiatry

## 2019-04-05 ENCOUNTER — Encounter: Payer: Self-pay | Admitting: Podiatry

## 2019-04-05 ENCOUNTER — Ambulatory Visit (INDEPENDENT_AMBULATORY_CARE_PROVIDER_SITE_OTHER): Payer: 59

## 2019-04-05 ENCOUNTER — Other Ambulatory Visit: Payer: Self-pay

## 2019-04-05 VITALS — BP 118/70 | HR 77 | Resp 16

## 2019-04-05 DIAGNOSIS — M2022 Hallux rigidus, left foot: Secondary | ICD-10-CM | POA: Diagnosis not present

## 2019-04-05 DIAGNOSIS — M779 Enthesopathy, unspecified: Secondary | ICD-10-CM | POA: Diagnosis not present

## 2019-04-05 NOTE — Patient Instructions (Signed)

## 2019-04-12 DIAGNOSIS — M2022 Hallux rigidus, left foot: Secondary | ICD-10-CM | POA: Insufficient documentation

## 2019-04-12 NOTE — Progress Notes (Signed)
Subjective:   Patient ID: Keith Armstrong, male   DOB: 53 y.o.   MRN: [id number]   HPI 53 year old male presents the office today for surgical consultation given pain to his left big toe joint.  He states this is been bothersome for quite some time he has tried shoe modifications and inserts but aseptic improvement.  He states he cannot bend his toe he gets swelling to the big toe joint.  He states it hurts to walk he has no range of motion.  This is been ongoing for last 15 years but is gradually getting worse.  He previously had been seen by Dr. Babs Bertin and was scheduled for surgery however due to issues he was unable to perform the surgery and presents today for surgical consultation.   Review of Systems  All other systems reviewed and are negative.  Past Medical History:  Diagnosis Date  . GERD (gastroesophageal reflux disease)   . Hepatitis C    non reactive  . HLD (hyperlipidemia) 05/29/2018  . Laryngopharyngeal reflux (LPR)   . Psoriasis   . STD (sexually transmitted disease)   . Tinnitus     Past Surgical History:  Procedure Laterality Date  . COLONOSCOPY    . INGUINAL HERNIA REPAIR Bilateral    and distended testicle  . TRIGGER FINGER RELEASE Left 07/30/2018   Procedure: RELEASE TRIGGER FINGER/A-1 PULLEY;  Surgeon: Daryll Brod, MD;  Location: [place];  Service: Orthopedics;  Laterality: Left;     Current Outpatient Medications:  .  CLOBETASOL PROP EMOLLIENT BASE EX, Apply topically., Disp: , Rfl:  .  traMADol (ULTRAM) 50 MG tablet, Take 1 tablet (50 mg total) by mouth every 6 (six) hours as needed., Disp: 20 tablet, Rfl: 0  Allergies  Allergen Reactions  . Bee Venom Anaphylaxis          Objective:  Physical Exam  General: AAO x3, NAD  Dermatological: Skin is warm, dry and supple bilateral. Nails x 10 are well manicured; remaining integument appears unremarkable at this time. There are no open sores, no preulcerative lesions, no rash or signs  of infection present.  Vascular: Dorsalis Pedis artery and Posterior Tibial artery pedal pulses are 2/4 bilateral with immedate capillary fill time. Pedal hair growth present. No varicosities and no lower extremity edema present bilateral. There is no pain with calf compression, swelling, warmth, erythema.   Neruologic: Grossly intact via light touch bilateral. VProtective threshold with Semmes Wienstein monofilament intact to all pedal sites bilateral.   Musculoskeletal: Limited range of motion of the left first MPJ with crepitation with range of motion.  Dorsal spurring present at the MPJ.  Minimal edema there is no erythema or warmth.  No other areas of tenderness.  Muscular strength 5/5 in all groups tested bilateral.  Gait: Unassisted, Nonantalgic.       Assessment:   Left hallux rigidus, capsulitis     Plan:  -Treatment options discussed including all alternatives, risks, and complications -Etiology of symptoms were discussed -X-rays were obtained and reviewed with the patient. Significant arthritic changes present of the first MPJ.  No evidence of acute fracture. -Patient was previously scheduled for what sounds to be a first metatarsal shortening osteotomy.  I discussed both conservative as well as surgical treatment options with him.  Given enough arthritis he has no joint I do not think that a joint preserving procedure would be his best option.  I recommended at first MPJ arthrodesis.  He was has not to do  this.  We discussed the first MPJ implant arthroplasty.  After long discussion he wants to proceed with an implant knowing the success rates as well as life expectancy of the implant. -The incision placement as well as the postoperative course was discussed with the patient. I discussed risks of the surgery which include, but not limited to, infection, bleeding, pain, swelling, need for further surgery, delayed or nonhealing, painful or ugly scar, numbness or sensation changes,  over/under correction, recurrence, transfer lesions, further deformity, hardware failure, DVT/PE, loss of toe/foot. Patient understands these risks and wishes to proceed with surgery. The surgical consent was reviewed with the patient all 3 pages were signed. No promises or guarantees were given to the outcome of the procedure. All questions were answered to the best of my ability. Before the surgery the patient was encouraged to call the office if there is any further questions. The surgery will be performed at the [facility] on an outpatient basis. -CAM boot dispensed for postoperative use    Vivi Barrack DPM

## 2019-04-19 ENCOUNTER — Telehealth: Payer: Self-pay | Admitting: *Deleted

## 2019-04-19 NOTE — Telephone Encounter (Addendum)
DOS 05/05/2019 KELLER BUNION IMPLANT LEFT FOOT - [id number]  [employer]: Eligibility Date - 03/21/2019 - 06/20/2019  Plan Deductible Per Service Year $5,000.00 of $5,000.00 Met Remaining: $0.00  Out-of-Pocket Maximum Per Service Year [phone] of $6,500.00 Met Remaining: $916.75  Copay $0.00 / visit  Co-Insurance 20%

## 2019-04-27 NOTE — Telephone Encounter (Signed)
Surgery was approved.  The authorization number is [id number].  Covered/Approved    1-1 Code  Description  Coverage Status DECISION DATE    [facility]  Coverage determination is reflected for the facility admission and is not a guarantee of payment for ongoing services. Covered/Approved 04/26/2019  1 [id number] Hallux rigidus correction with cheilecto more  Covered/Approved 04/26/2019

## 2019-05-04 ENCOUNTER — Other Ambulatory Visit: Payer: Self-pay | Admitting: Podiatry

## 2019-05-04 ENCOUNTER — Telehealth: Payer: Self-pay | Admitting: Podiatry

## 2019-05-04 MED ORDER — PROMETHAZINE HCL 25 MG PO TABS: 25.0000 mg | ORAL_TABLET | Freq: Three times a day (TID) | ORAL | 0 refills | Status: DC | PRN

## 2019-05-04 MED ORDER — OXYCODONE-ACETAMINOPHEN 5-325 MG PO TABS: 1.0000 | ORAL_TABLET | Freq: Four times a day (QID) | ORAL | 0 refills | Status: DC | PRN

## 2019-05-04 MED ORDER — CEPHALEXIN 500 MG PO CAPS: 500.0000 mg | ORAL_CAPSULE | Freq: Three times a day (TID) | ORAL | 0 refills | Status: DC

## 2019-05-04 NOTE — Progress Notes (Signed)
Postop medications sent 

## 2019-05-04 NOTE — Telephone Encounter (Signed)
Called patient to see if he had any questions or concerns before surgery tomorrow. He has no further questions or concerns and he is excited to proceed tomorrow as scheduled. Will send post

## 2019-05-04 NOTE — Telephone Encounter (Signed)
Will send postop medications to patients pharmacy.

## 2019-05-05 ENCOUNTER — Encounter: Payer: Self-pay | Admitting: Podiatry

## 2019-05-05 DIAGNOSIS — M2022 Hallux rigidus, left foot: Secondary | ICD-10-CM | POA: Diagnosis not present

## 2019-05-06 ENCOUNTER — Encounter: Payer: Self-pay | Admitting: Podiatry

## 2019-05-06 ENCOUNTER — Telehealth: Payer: Self-pay | Admitting: *Deleted

## 2019-05-06 MED ORDER — IBUPROFEN 800 MG PO TABS: 800.0000 mg | ORAL_TABLET | Freq: Three times a day (TID) | ORAL | 0 refills | Status: DC | PRN

## 2019-05-06 NOTE — Telephone Encounter (Signed)
Called and spoke with the patient and the patient stated that the pain was intense but the feeling was coming back and patient had 2 doses of the pain medicine and was nauseous after taken but did state that he took it with the food and I stated to eat first and then take the medicine and also patient stated that he does not like to take pain medicine and I stated that the patient could take ibuprofen instead and patient agreed with that and I stated also that the patient could cut the medicine in half if need be and I stated to call the office with any concerns or questions and that we would be open on Saturday dec 19 as well and that we would see patient next week for post op appointment. Lattie Haw

## 2019-05-11 ENCOUNTER — Ambulatory Visit (INDEPENDENT_AMBULATORY_CARE_PROVIDER_SITE_OTHER): Payer: 59 | Admitting: Podiatry

## 2019-05-11 ENCOUNTER — Other Ambulatory Visit: Payer: Self-pay

## 2019-05-11 ENCOUNTER — Ambulatory Visit (INDEPENDENT_AMBULATORY_CARE_PROVIDER_SITE_OTHER): Payer: 59

## 2019-05-11 DIAGNOSIS — Z9889 Other specified postprocedural states: Secondary | ICD-10-CM

## 2019-05-11 DIAGNOSIS — M2022 Hallux rigidus, left foot: Secondary | ICD-10-CM

## 2019-05-12 ENCOUNTER — Other Ambulatory Visit: Payer: Self-pay | Admitting: Podiatry

## 2019-05-12 DIAGNOSIS — Z9889 Other specified postprocedural states: Secondary | ICD-10-CM

## 2019-05-17 NOTE — Progress Notes (Signed)
Subjective: Keith Armstrong is a 53 y.o. is seen today in office s/p left 1st MTPJ arthroplasty with implant preformed on 05/05/2019.  He states he has been doing well only very minimal discomfort.  He has been wearing the cam boot he has been icing elevating.  No recent injury.  Denies any systemic complaints such as fevers, chills, nausea, vomiting. No calf pain, chest pain, shortness of breath.   Objective: General: No acute distress, AAOx3  DP/PT pulses palpable 2/4, CRT < 3 sec to all digits.  Protective sensation intact. Motor function intact.  LEFT foot: Incision is well coapted without any evidence of dehiscence and sutures are intact. There is no surrounding erythema, ascending cellulitis, fluctuance, crepitus, malodor, drainage/purulence. There is mild edema around the surgical site. There is mild pain along the surgical site.  No other areas of tenderness to bilateral lower extremities.  No other open lesions or pre-ulcerative lesions.  No pain with calf compression, swelling, warmth, erythema.   Assessment and Plan:  Status post left foot surgery, doing well with no complications   -Treatment options discussed including all alternatives, risks, and complications -X-rays obtained and reviewed.  Hardware intact of the first MPJ without any evidence of acute fracture. -Antibiotic ointment and a dressing applied to the incision.  Keep the dressing clean, dry, intact -Ice/elevation -Pain medication as needed. -Monitor for any clinical signs or symptoms of infection and DVT/PE and directed to call the office immediately should any occur or go to the ER. -Follow-up as scheduled or sooner if any problems arise. In the meantime, encouraged to call the office with any questions, concerns, change in symptoms.   Celesta Gentile, DPM

## 2019-05-18 ENCOUNTER — Other Ambulatory Visit: Payer: Self-pay

## 2019-05-18 ENCOUNTER — Ambulatory Visit (INDEPENDENT_AMBULATORY_CARE_PROVIDER_SITE_OTHER): Payer: 59 | Admitting: Podiatry

## 2019-05-18 DIAGNOSIS — Z9889 Other specified postprocedural states: Secondary | ICD-10-CM

## 2019-05-18 DIAGNOSIS — M2022 Hallux rigidus, left foot: Secondary | ICD-10-CM

## 2019-05-18 NOTE — Progress Notes (Signed)
Subjective: Keith Armstrong is a 53 y.o. is seen today in office s/p left 1st MTPJ arthroplasty with implant preformed on 05/05/2019. He states he has been doing well he is not having any discomfort.  Is not taking any pain medication including ibuprofen.  No recent injury or falls is been wearing a surgical shoe. Denies any systemic complaints such as fevers, chills, nausea, vomiting. No calf pain, chest pain, shortness of breath.   Objective: General: No acute distress, AAOx3  DP/PT pulses palpable 2/4, CRT < 3 sec to all digits.  Protective sensation intact. Motor function intact.  LEFT foot: Incision is well coapted without any evidence of dehiscence.  Small scab at the distal aspect of the incision but incision is healing well with any dehiscence or infection.  There is no surrounding erythema, drainage or pus or any clinical signs of infection.  There is no fluctuation or crepitation.  There is no malodor.  No pain with MPJ range of motion today but somewhat restricted.  No other open lesions or pre-ulcerative lesions.  No pain with calf compression, swelling, warmth, erythema.   Assessment and Plan:  Status post left foot surgery, doing well with no complications   -Treatment options discussed including all alternatives, risks, and complications -Incisions healing well.  Discussed with him he can remove the bandage later this week and start showering wash the foot.  Apply a small amount of antibiotic ointment and a dressing.  Continue cam boot.  Discussed gentle range of motion exercises for the first MPJ. -Ice/elevation -Pain medication as needed. -Monitor for any clinical signs or symptoms of infection and DVT/PE and directed to call the office immediately should any occur or go to the ER. -Follow-up as scheduled or sooner if any problems arise. In the meantime, encouraged to call the office with any questions, concerns, change in symptoms.   Celesta Gentile, DPM

## 2019-06-01 ENCOUNTER — Ambulatory Visit (INDEPENDENT_AMBULATORY_CARE_PROVIDER_SITE_OTHER): Payer: 59 | Admitting: Podiatry

## 2019-06-01 ENCOUNTER — Other Ambulatory Visit: Payer: Self-pay

## 2019-06-01 ENCOUNTER — Ambulatory Visit: Payer: 59

## 2019-06-01 ENCOUNTER — Encounter: Payer: Self-pay | Admitting: Podiatry

## 2019-06-01 DIAGNOSIS — M2022 Hallux rigidus, left foot: Secondary | ICD-10-CM

## 2019-06-01 DIAGNOSIS — Z9889 Other specified postprocedural states: Secondary | ICD-10-CM

## 2019-06-09 NOTE — Progress Notes (Signed)
Subjective: Keith Armstrong is a 54 y.o. is seen today in office s/p left 1st MTPJ arthroplasty with implant preformed on 05/05/2019. He states that he is doing "great".  He presents today wearing a regular shoe as the cam boot was causing irritation and a wound on the leg.  He states that he got a new puppy and the dog got loose and he was running out of the cam boot for about 2 hours causing it to irritate the skin.  He states he has been feeling well wearing a regular shoe however he does get swelling.  He denies any fevers, chills, nausea, vomiting.  No calf pain, chest pain or shortness of breath.   Objective: General: No acute distress, AAOx3  DP/PT pulses palpable 2/4, CRT < 3 sec to all digits.  Protective sensation intact. Motor function intact.  LEFT foot: Incision is well coapted without any evidence of dehiscence.  There is swelling on the first MPJ but there is no fluctuation of the incision.  There is no drainage or pus or any clinical signs of infection.  No pain with MPJ range of motion.  He does state that after he is been on his feet for some time he does get discomfort in a regular shoe. No pain with calf compression, swelling, warmth, erythema.   Assessment and Plan:  Status post left foot surgery, doing well with no complications   -Treatment options discussed including all alternatives, risks, and complications -Incision is well-healed with a scar.  There is improved range of motion of first MPJ.  Due to the swelling as well as discomfort after being on a splint, and the risk of the surgical shoe which was dispensed today.  Continue to ice elevate as well as compression anklet.  Monitoring signs or symptoms of infection.  Return in about 2 weeks (around 06/15/2019).  Vivi Barrack DPM

## 2019-06-15 ENCOUNTER — Encounter: Payer: Self-pay | Admitting: Podiatry

## 2019-06-15 ENCOUNTER — Ambulatory Visit (INDEPENDENT_AMBULATORY_CARE_PROVIDER_SITE_OTHER): Payer: 59

## 2019-06-15 ENCOUNTER — Ambulatory Visit (INDEPENDENT_AMBULATORY_CARE_PROVIDER_SITE_OTHER): Payer: 59 | Admitting: Podiatry

## 2019-06-15 ENCOUNTER — Other Ambulatory Visit: Payer: Self-pay

## 2019-06-15 DIAGNOSIS — Z9889 Other specified postprocedural states: Secondary | ICD-10-CM

## 2019-06-15 DIAGNOSIS — M2022 Hallux rigidus, left foot: Secondary | ICD-10-CM | POA: Diagnosis not present

## 2019-06-15 MED ORDER — IBUPROFEN 800 MG PO TABS: 800.0000 mg | ORAL_TABLET | Freq: Three times a day (TID) | ORAL | 0 refills | Status: DC | PRN

## 2019-06-22 NOTE — Progress Notes (Signed)
Subjective: Keith Armstrong is a 54 y.o. is seen today in office s/p left 1st MTPJ arthroplasty with implant preformed on 05/05/2019.  He states he is doing well not having any issues.  He is wearing a surgical shoe.  He can work on range of motion exercises.  Swelling is improved.  Incision is well-healed.  Not take any pain medication.  Is no other concerns. He denies any fevers, chills, nausea, vomiting.  No calf pain, chest pain or shortness of breath.   Objective: General: No acute distress, AAOx3  DP/PT pulses palpable 2/4, CRT < 3 sec to all digits.  Protective sensation intact. Motor function intact.  LEFT foot: Incision is well coapted without any evidence of dehiscence and a scar is formed.  There is mild swelling there is no significant erythema or warmth.  There is no drainage or pus or any fluctuation crepitation or any signs of infection. Improved range of motion of the first MPJ but still some restriction.  There is no pain with range of motion. No pain with calf compression, swelling, warmth, erythema.   Assessment and Plan:  Status post left foot surgery, doing well with no complications   -Treatment options discussed including all alternatives, risks, and complications -Overall he is doing better.  Swelling is improved no signs of infection.  Discussed physical therapy to work on range of motion balance and also continue range of motion exercises at home.  As he starts to feel better he can start to transition back into regular shoe but discussed doing this on a gradual basis.  If there is any increasing pain or swelling to return to the surgical shoe and on the nail.  Return in about 3 weeks (around 07/06/2019) for post-op visit .  Vivi Barrack DPM

## 2019-06-30 ENCOUNTER — Telehealth: Payer: Self-pay | Admitting: *Deleted

## 2019-06-30 ENCOUNTER — Other Ambulatory Visit: Payer: Self-pay | Admitting: Podiatry

## 2019-06-30 MED ORDER — TRAMADOL HCL 50 MG PO TABS: 50.0000 mg | ORAL_TABLET | Freq: Two times a day (BID) | ORAL | 0 refills | Status: DC | PRN

## 2019-06-30 NOTE — Telephone Encounter (Signed)
I called the patient back and stated that the tramadol was called into the pharmacy and patient stated that the big toe where the surgery was done at was doing good it was the 2-5 toes that was giving him a fit and was sore to walk and top of foot is swelling and 60% comfortable and 40% hurting and patient has an appointment on 07-05-2019 at 10 am and I stated to call the office if any concerns or questions at [phone]. Misty Stanley

## 2019-06-30 NOTE — Telephone Encounter (Signed)
-----   Message from Vivi Barrack, DPM sent at 06/30/2019  3:00 PM EST ----- I have sent it to the pharmacy for him. Can you let him know but also if the pain is worse then let me know. Can you see when he is getting the most discomfort? Thanks.  ----- Message ----- From: Lanney Gins, [facility] Sent: 06/30/2019  11:43 AM EST To: Vivi Barrack, DPM  Hey Dr Ardelle Anton, patient is requesting a RX of Tramadol due to have some discomfort and ibuprofen is not helping. Misty Stanley

## 2019-07-06 ENCOUNTER — Ambulatory Visit (INDEPENDENT_AMBULATORY_CARE_PROVIDER_SITE_OTHER): Payer: 59 | Admitting: Podiatry

## 2019-07-06 ENCOUNTER — Other Ambulatory Visit: Payer: Self-pay

## 2019-07-06 ENCOUNTER — Encounter: Payer: Self-pay | Admitting: Podiatry

## 2019-07-06 ENCOUNTER — Ambulatory Visit (INDEPENDENT_AMBULATORY_CARE_PROVIDER_SITE_OTHER): Payer: Self-pay

## 2019-07-06 DIAGNOSIS — T8189XA Other complications of procedures, not elsewhere classified, initial encounter: Secondary | ICD-10-CM

## 2019-07-06 DIAGNOSIS — Z9889 Other specified postprocedural states: Secondary | ICD-10-CM

## 2019-07-06 DIAGNOSIS — M2022 Hallux rigidus, left foot: Secondary | ICD-10-CM

## 2019-07-06 DIAGNOSIS — M84375A Stress fracture, left foot, initial encounter for fracture: Secondary | ICD-10-CM

## 2019-07-06 MED ORDER — TRAMADOL HCL 50 MG PO TABS: 50.0000 mg | ORAL_TABLET | Freq: Two times a day (BID) | ORAL | 0 refills | Status: DC | PRN

## 2019-07-12 NOTE — Progress Notes (Signed)
Subjective: Keith Armstrong is a 54 y.o. is seen today in office s/p left 1st MTPJ arthroplasty with implant preformed on 05/05/2019.  He states that the big toe joint and the surgical site is doing well without any pain however he started get pain to the other toes and the tramadol is been helpful.  He is return to the surgical shoe.  No recent injury that he can recall but he has been on his feet a lot recently. No other concerns. He denies any fevers, chills, nausea, vomiting.  No calf pain, chest pain or shortness of breath.  Objective: General: No acute distress, AAOx3  DP/PT pulses palpable 2/4, CRT < 3 sec to all digits.  Protective sensation intact. Motor function intact.  LEFT foot: Incision is well coapted without any evidence of dehiscence and a scar is formed.  There was a scab on the proximal aspect incision that came off and there was a suture reaction.  There is no drainage or pus there is no edema, erythema to this area.  No tenderness.  This appears to be superficial.  Tenderness directly on second of tarsal diaphysis but no other areas of pinpoint tenderness. No pain with calf compression, swelling, warmth, erythema.   Assessment and Plan:  Status post left foot surgery with suture reaction, likely stress fracture second metatarsal  -Treatment options discussed including all alternatives, risks, and complications -The proximal aspect the incision which is scab recently came off.  There was still a piece of suture present from the subcutaneous tissue which was removed.  There is no signs of infection there is no drainage or pus.  There is no edema, erythema.  Recommended to clean the area daily and apply a small amount of antibiotic ointment.  Monitor closely for any signs or symptoms of infection. -Given the fracture remain in surgical shoe but also he has a cam boot at home that I think will give him better stability that he is going to wear.  The previously did cause irritation to  his leg but this is when he was on his feet right after surgery walking for 2 hours trying to find his dog.  Should that do not fit discussed with him he can come to the office and we will get him a new one. -Monitor for any clinical signs or symptoms of infection and directed to call the office immediately should any occur or go to the ER.  Return in about 3 weeks (around 07/27/2019) for stress fracture/ x-ray.  Vivi Barrack DPM

## 2019-07-19 ENCOUNTER — Other Ambulatory Visit: Payer: Self-pay | Admitting: Podiatry

## 2019-07-20 MED ORDER — IBUPROFEN 800 MG PO TABS: 800.0000 mg | ORAL_TABLET | Freq: Three times a day (TID) | ORAL | 0 refills | Status: DC | PRN

## 2019-07-20 MED ORDER — TRAMADOL HCL 50 MG PO TABS: 50.0000 mg | ORAL_TABLET | Freq: Two times a day (BID) | ORAL | 0 refills | Status: DC | PRN

## 2019-07-27 ENCOUNTER — Ambulatory Visit (INDEPENDENT_AMBULATORY_CARE_PROVIDER_SITE_OTHER): Payer: Self-pay

## 2019-07-27 ENCOUNTER — Other Ambulatory Visit: Payer: Self-pay

## 2019-07-27 ENCOUNTER — Other Ambulatory Visit: Payer: Self-pay | Admitting: Podiatry

## 2019-07-27 ENCOUNTER — Encounter: Payer: Self-pay | Admitting: Podiatry

## 2019-07-27 ENCOUNTER — Ambulatory Visit (INDEPENDENT_AMBULATORY_CARE_PROVIDER_SITE_OTHER): Payer: Self-pay | Admitting: Podiatry

## 2019-07-27 VITALS — Temp 98.2°F

## 2019-07-27 DIAGNOSIS — M84375S Stress fracture, left foot, sequela: Secondary | ICD-10-CM

## 2019-07-27 DIAGNOSIS — M84375A Stress fracture, left foot, initial encounter for fracture: Secondary | ICD-10-CM

## 2019-07-27 MED ORDER — TRAMADOL HCL 50 MG PO TABS: 50.0000 mg | ORAL_TABLET | Freq: Two times a day (BID) | ORAL | 0 refills | Status: DC | PRN

## 2019-07-29 NOTE — Progress Notes (Signed)
Subjective: Keith Armstrong is a 54 y.o. is seen today in office s/p left 1st MTPJ arthroplasty with implant preformed on 05/05/2019.  Overall he states he is doing better.  His pain level is 2/10 still gets some swelling.  Has been wearing the surgical shoe and he states he has been transapical his foot more.  No recent injury or fall since I last saw him.  He has no other concerns today. Denies any fevers, chills, nausea, vomiting.  No calf pain, chest pain or shortness of breath.  Objective: General: No acute distress, AAOx3  DP/PT pulses palpable 2/4, CRT < 3 sec to all digits.  Protective sensation intact. Motor function intact.  LEFT foot: Incision is well coapted without any evidence of dehiscence and a scar is formed.  There is no pain on the first MPJ there is no pain with MPJ range of motion but there is no erythema or warmth.  Majority of tenderness in the second metatarsal also mild to the fourth metatarsal.  There is still edema to the dorsal aspect the foot there is no erythema or warmth.  No pain with calf compression, swelling, warmth, erythema.   Assessment and Plan:  Status post left foot surgery with suture reaction, stress fracture second metatarsal  -Treatment options discussed including all alternatives, risks, and complications -Repeat x-rays were obtained and reviewed with him.  Implant intact the first MPJ.  Callus formation of the second metatarsal with evidence of healing along the fracture site. -He has no pain at first MPJ and he states is the most he is been able to move his toes but not long time.  Continue range of motion exercises for the first MPJ. -Given the fracture however continue to wear the surgical shoe.  Continue to ice elevate and limit activity. -Refill tramadol to take only as needed.  Hopefully we can wean off of this.  Return in about 3 weeks (around 08/17/2019) for stress fracture; needs x-ray.  Vivi Barrack DPM

## 2019-08-17 ENCOUNTER — Ambulatory Visit (INDEPENDENT_AMBULATORY_CARE_PROVIDER_SITE_OTHER): Payer: BC Managed Care – PPO

## 2019-08-17 ENCOUNTER — Other Ambulatory Visit: Payer: Self-pay

## 2019-08-17 ENCOUNTER — Ambulatory Visit (INDEPENDENT_AMBULATORY_CARE_PROVIDER_SITE_OTHER): Payer: BC Managed Care – PPO | Admitting: Podiatry

## 2019-08-17 ENCOUNTER — Other Ambulatory Visit: Payer: Self-pay | Admitting: Podiatry

## 2019-08-17 DIAGNOSIS — M84375D Stress fracture, left foot, subsequent encounter for fracture with routine healing: Secondary | ICD-10-CM

## 2019-08-17 DIAGNOSIS — M84375A Stress fracture, left foot, initial encounter for fracture: Secondary | ICD-10-CM | POA: Diagnosis not present

## 2019-08-17 DIAGNOSIS — M2022 Hallux rigidus, left foot: Secondary | ICD-10-CM | POA: Diagnosis not present

## 2019-08-17 DIAGNOSIS — M779 Enthesopathy, unspecified: Secondary | ICD-10-CM | POA: Diagnosis not present

## 2019-08-18 NOTE — Progress Notes (Signed)
Subjective: Keith Armstrong is a 54 y.o. is seen today in office s/p left 1st MTPJ arthroplasty with implant preformed on 05/05/2019.  Subsequently he developed the stress fracture along the second metatarsal.  He states he is doing well and is having no pain.  The swelling is substantial improved as well.  He has gone for short amount of time without the surgical shoe without any pain.  No recent injury or changes otherwise.  He has not been taking any pain medicine recently.  He has been working. Denies any fevers, chills, nausea, vomiting.  No calf pain, chest pain or shortness of breath.  Objective: General: No acute distress, AAOx3  DP/PT pulses palpable 2/4, CRT < 3 sec to all digits.  Protective sensation intact. Motor function intact.  LEFT foot: Incision is well coapted without any evidence of dehiscence and a scar is formed.  There is no pain with pressure to range of motion.  There is no pain the second metatarsal or other metatarsals.  No other areas of discomfort.  Flexor, extensor tendons appear to be intact. No pain with calf compression, swelling, warmth, erythema.   Assessment and Plan:  Status post left foot surgery with suture reaction, stress fracture second metatarsal  -Treatment options discussed including all alternatives, risks, and complications -Repeat x-rays were obtained and reviewed with him.  Implant intact the first MPJ.  Callus formation of the second metatarsal with evidence of healing along the fracture site.  No evidence of acute fracture otherwise. -He has been doing well and there is no pain and improved swelling.  He has been in the surgical shoe.  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  Also I would like for him to do physical therapy prescription for [facility] was written.  Discussed gradual transition to regular shoe but there is any increasing pain or swelling to return to the surgical shoe and he  understands this.  Return in about 3 weeks (around 09/07/2019) for pick up orthtoics .  Vivi Barrack DPM

## 2019-08-19 NOTE — Addendum Note (Signed)
Addended by: Hadley Pen R on: 08/19/2019 02:02 PM   Modules accepted: Orders

## 2019-09-07 ENCOUNTER — Other Ambulatory Visit: Payer: Self-pay

## 2019-09-07 ENCOUNTER — Ambulatory Visit: Payer: BC Managed Care – PPO | Admitting: Orthotics

## 2019-09-07 DIAGNOSIS — M84375A Stress fracture, left foot, initial encounter for fracture: Secondary | ICD-10-CM

## 2019-09-07 DIAGNOSIS — M2022 Hallux rigidus, left foot: Secondary | ICD-10-CM

## 2019-09-07 NOTE — Progress Notes (Signed)
Patient came in today to pick up custom made foot orthotics.  The goals were accomplished and the patient reported no dissatisfaction with said orthotics.  Patient was advised of breakin period and how to report any issues. 

## 2020-01-03 ENCOUNTER — Encounter: Payer: Self-pay | Admitting: Internal Medicine

## 2020-01-04 MED ORDER — EPINEPHRINE 0.3 MG/0.3ML IJ SOAJ: 0.3000 mg | INTRAMUSCULAR | 1 refills | Status: DC | PRN

## 2020-12-13 ENCOUNTER — Encounter: Payer: Self-pay | Admitting: Internal Medicine

## 2020-12-13 ENCOUNTER — Other Ambulatory Visit: Payer: Self-pay

## 2020-12-13 ENCOUNTER — Ambulatory Visit (INDEPENDENT_AMBULATORY_CARE_PROVIDER_SITE_OTHER): Payer: No Typology Code available for payment source | Admitting: Internal Medicine

## 2020-12-13 ENCOUNTER — Ambulatory Visit (INDEPENDENT_AMBULATORY_CARE_PROVIDER_SITE_OTHER): Payer: No Typology Code available for payment source

## 2020-12-13 VITALS — BP 118/62 | HR 67 | Temp 97.9°F | Ht 70.0 in | Wt 138.0 lb

## 2020-12-13 DIAGNOSIS — M25561 Pain in right knee: Secondary | ICD-10-CM

## 2020-12-13 DIAGNOSIS — E7800 Pure hypercholesterolemia, unspecified: Secondary | ICD-10-CM

## 2020-12-13 DIAGNOSIS — E538 Deficiency of other specified B group vitamins: Secondary | ICD-10-CM | POA: Diagnosis not present

## 2020-12-13 DIAGNOSIS — G8929 Other chronic pain: Secondary | ICD-10-CM

## 2020-12-13 DIAGNOSIS — J410 Simple chronic bronchitis: Secondary | ICD-10-CM

## 2020-12-13 DIAGNOSIS — Z0001 Encounter for general adult medical examination with abnormal findings: Secondary | ICD-10-CM

## 2020-12-13 DIAGNOSIS — E559 Vitamin D deficiency, unspecified: Secondary | ICD-10-CM | POA: Diagnosis not present

## 2020-12-13 DIAGNOSIS — R739 Hyperglycemia, unspecified: Secondary | ICD-10-CM | POA: Diagnosis not present

## 2020-12-13 DIAGNOSIS — F17200 Nicotine dependence, unspecified, uncomplicated: Secondary | ICD-10-CM

## 2020-12-13 HISTORY — DX: Deficiency of other specified B group vitamins: E53.8

## 2020-12-13 LAB — CBC WITH DIFFERENTIAL/PLATELET
Basophils Absolute: 0.1 10*3/uL (ref 0.0–0.1)
Basophils Relative: 1.2 % (ref 0.0–3.0)
Eosinophils Absolute: 0.1 10*3/uL (ref 0.0–0.7)
Eosinophils Relative: 1.5 % (ref 0.0–5.0)
HCT: 45.5 % (ref 39.0–52.0)
Hemoglobin: 15.2 g/dL (ref 13.0–17.0)
Lymphocytes Relative: 35.2 % (ref 12.0–46.0)
Lymphs Abs: 2.3 10*3/uL (ref 0.7–4.0)
MCHC: 33.4 g/dL (ref 30.0–36.0)
MCV: 99.8 fl (ref 78.0–100.0)
Monocytes Absolute: 0.7 10*3/uL (ref 0.1–1.0)
Monocytes Relative: 10.9 % (ref 3.0–12.0)
Neutro Abs: 3.3 10*3/uL (ref 1.4–7.7)
Neutrophils Relative %: 51.2 % (ref 43.0–77.0)
Platelets: 284 10*3/uL (ref 150.0–400.0)
RBC: 4.56 Mil/uL (ref 4.22–5.81)
RDW: 13.1 % (ref 11.5–15.5)
WBC: 6.4 10*3/uL (ref 4.0–10.5)

## 2020-12-13 LAB — URINALYSIS, ROUTINE W REFLEX MICROSCOPIC
Bilirubin Urine: NEGATIVE
Hgb urine dipstick: NEGATIVE
Ketones, ur: NEGATIVE
Leukocytes,Ua: NEGATIVE
Nitrite: NEGATIVE
RBC / HPF: NONE SEEN (ref 0–?)
Specific Gravity, Urine: 1.02 (ref 1.000–1.030)
Total Protein, Urine: NEGATIVE
Urine Glucose: NEGATIVE
Urobilinogen, UA: 0.2 (ref 0.0–1.0)
WBC, UA: NONE SEEN (ref 0–?)
pH: 6.5 (ref 5.0–8.0)

## 2020-12-13 LAB — VITAMIN B12: Vitamin B-12: 207 pg/mL — ABNORMAL LOW (ref 211–911)

## 2020-12-13 LAB — BASIC METABOLIC PANEL
BUN: 8 mg/dL (ref 6–23)
CO2: 29 mEq/L (ref 19–32)
Calcium: 9.2 mg/dL (ref 8.4–10.5)
Chloride: 103 mEq/L (ref 96–112)
Creatinine, Ser: 0.74 mg/dL (ref 0.40–1.50)
GFR: 104.05 mL/min (ref >60.00)
Glucose, Bld: 87 mg/dL (ref 70–99)
Potassium: 3.9 mEq/L (ref 3.5–5.1)
Sodium: 140 mEq/L (ref 135–145)

## 2020-12-13 LAB — LIPID PANEL
Cholesterol: 170 mg/dL (ref 0–200)
HDL: 75.6 mg/dL (ref >39.00)
LDL Cholesterol: 84 mg/dL (ref 0–99)
NonHDL: 93.94
Total CHOL/HDL Ratio: 2
Triglycerides: 50 mg/dL (ref 0.0–149.0)
VLDL: 10 mg/dL (ref 0.0–40.0)

## 2020-12-13 LAB — HEPATIC FUNCTION PANEL
ALT: 16 U/L (ref 0–53)
AST: 20 U/L (ref 0–37)
Albumin: 4.6 g/dL (ref 3.5–5.2)
Alkaline Phosphatase: 49 U/L (ref 39–117)
Bilirubin, Direct: 0.1 mg/dL (ref 0.0–0.3)
Total Bilirubin: 0.7 mg/dL (ref 0.2–1.2)
Total Protein: 7.2 g/dL (ref 6.0–8.3)

## 2020-12-13 LAB — PSA: PSA: 1.23 ng/mL (ref 0.10–4.00)

## 2020-12-13 LAB — VITAMIN D 25 HYDROXY (VIT D DEFICIENCY, FRACTURES): VITD: 49.38 ng/mL (ref 30.00–100.00)

## 2020-12-13 LAB — HEMOGLOBIN A1C: Hgb A1c MFr Bld: 5.6 % (ref 4.6–6.5)

## 2020-12-13 LAB — TSH: TSH: 1.81 u[IU]/mL (ref 0.35–5.50)

## 2020-12-13 NOTE — Assessment & Plan Note (Signed)
Lab Results  Component Value Date   LDLCALC 84 12/13/2020    pt to continue current low chol diet, declines statin, but to check card CT score, and consider statin for abormal calcium score as goal LDL would then be < 70

## 2020-12-13 NOTE — Assessment & Plan Note (Signed)
Lab Results  Component Value Date   HGBA1C 5.6 12/13/2020   Stable, pt to continue current medical treatment  - diet  

## 2020-12-13 NOTE — Assessment & Plan Note (Signed)
Uncontrolled pain, c/w probable djd, for sport med referral for possible cortison

## 2020-12-13 NOTE — Assessment & Plan Note (Signed)
Lab Results  Component Value Date   VITAMINB12 207 (L) 12/13/2020   Low, to start cont oral replacement - b12 1000 mcg qd

## 2020-12-13 NOTE — Patient Instructions (Addendum)
You will be contacted regarding the referral for: Pulmonary for the LDCT program  We have discussed the Cardiac CT Score test to measure the calcification level (if any) in your heart arteries.  This test has been ordered in our Computer System, so please call [facility] directly, as they prefer this, at [phone] to be scheduled.  Please continue all other medications as before, and refills have been done if requested.  Please have the pharmacy call with any other refills you may need.  Please continue your efforts at being more active, low cholesterol diet, and weight control.  You are otherwise up to date with prevention measures today.  Please keep your appointments with your specialists as you may have planned  You will be contacted regarding the referral for: Sports Medicine  Please go to the XRAY Department in the first floor for the x-ray testing for the right knee and the chest xray  Please go to the LAB at the blood drawing area for the tests to be done  You will be contacted by phone if any changes need to be made immediately.  Otherwise, you will receive a letter about your results with an explanation, but please check with MyChart first.  Please remember to sign up for MyChart if you have not done so, as this will be important to you in the future with finding out test results, communicating by private email, and scheduling acute appointments online when needed.  Please make an Appointment to return for your 1 year visit, or sooner if needed

## 2020-12-13 NOTE — Assessment & Plan Note (Signed)
Age and sex appropriate education and counseling updated with regular exercise and diet Referrals for preventative services - none needed Immunizations addressed - declines covid vax and shingrix Smoking counseling  - pt counseled to quit, pt not ready Evidence for depression or other mood disorder - none significant Most recent labs reviewed. I have personally reviewed and have noted: 1) the patient's medical and social history 2) The patient's current medications and supplements 3) The patient's height, weight, and BMI have been recorded in the chart

## 2020-12-13 NOTE — Progress Notes (Signed)
Chief Complaint:: wellness exam and hld, smoking, right knee pain, lung cancer screening, b12 deficiency       HPI:  Keith Armstrong is a 55 y.o. male here for wellness exam; declines covid vax and shingrix, o/w up to date with preventive referrals and immunizations                        Also did have left first MTP surgury, now much more active and lost much wt and feels great.  Trying to follow lower chol diet.  Still smoking, not ready to quit, and asking for lung cancer screening.  Pt denies chest pain, increased sob or doe, wheezing, orthopnea, PND, increased LE swelling, palpitations, dizziness or syncope.   Pt denies polydipsia, polyuria, or new focal neuro s/s.   Pt denies fever, wt loss, night sweats, loss of appetite, or other constitutional symptoms   not taking B12.  Also has worsening 6 mo daily intermittent right knee pain, with trace intermittent swelling, pain about 7/10 at times, worse to walk, better to sit but now bad enough to want referral possible cortisone.  No giveaways or falls.  Nothing else makes better or worse.  Wt Readings from Last 3 Encounters:  12/13/20 138 lb (62.6 kg)  07/30/18 194 lb 3.6 oz (88.1 kg)  07/03/18 194 lb (88 kg)   BP Readings from Last 3 Encounters:  12/13/20 118/62  04/05/19 118/70  07/30/18 125/86   Immunization History  Administered Date(s) Administered   Hepatitis A, Adult 12/14/2013   Hepatitis B, adult 12/14/2013, 01/13/2014, 06/15/2014   Influenza,inj,Quad PF,6+ Mos 01/13/2014   PFIZER(Purple Top)SARS-COV-2 Vaccination 06/21/2019, 07/12/2019  There are no preventive care reminders to display for this patient.    Past Medical History:  Diagnosis Date   B12 deficiency 12/13/2020   GERD (gastroesophageal reflux disease)    Hepatitis C    non reactive   HLD (hyperlipidemia) 05/29/2018   Laryngopharyngeal reflux (LPR)    Psoriasis    STD (sexually transmitted disease)    Tinnitus    Past Surgical History:  Procedure  Laterality Date   COLONOSCOPY     INGUINAL HERNIA REPAIR Bilateral    and distended testicle   TRIGGER FINGER RELEASE Left 07/30/2018   Procedure: RELEASE TRIGGER FINGER/A-1 PULLEY;  Surgeon: Cindee Salt, MD;  Location: [facility];  Service: Orthopedics;  Laterality: Left;    reports that he quit smoking about 13 years ago. He has a 20.00 pack-year smoking history. He has never used smokeless tobacco. He reports previous alcohol use of about 4.0 standard drinks of alcohol per week. He reports previous drug use. Drugs: Cocaine and Marijuana. family history includes Cancer in his brother; Cancer - Lung in his mother; Diabetes in his maternal uncle; Heart attack in his maternal grandmother; Heart disease in his maternal grandmother; Hypertension in his mother; Kidney failure in his maternal uncle; Lung cancer in his mother; Other in his father. Allergies  Allergen Reactions   Bee Venom Anaphylaxis   Current Outpatient Medications on File Prior to Visit  Medication Sig Dispense Refill   CLOBETASOL PROP EMOLLIENT BASE EX Apply topically. (Patient not taking: Reported on 12/13/2020)     EPINEPHrine 0.3 mg/0.3 mL IJ SOAJ injection Inject 0.3 mLs (0.3 mg total) into the muscle as needed for anaphylaxis. (Patient not taking: Reported on 12/13/2020) 2 each 1   ibuprofen (ADVIL) 800 MG tablet Take 1 tablet (800 mg  total) by mouth every 8 (eight) hours as needed. (Patient not taking: Reported on 12/13/2020) 14 tablet 0   traMADol (ULTRAM) 50 MG tablet Take 1 tablet (50 mg total) by mouth every 12 (twelve) hours as needed. (Patient not taking: Reported on 12/13/2020) 15 tablet 0   No current facility-administered medications on file prior to visit.        ROS:  All others reviewed and negative.  Objective        PE:  BP 118/62   Pulse 67   Temp 97.9 F (36.6 C) (Oral)   Ht 5\' 10"  (1.778 m)   Wt 138 lb (62.6 kg)   SpO2 96%   BMI 19.80 kg/m                 Constitutional: Pt appears in  NAD               HENT: Head: NCAT.                Right Ear: External ear normal.                 Left Ear: External ear normal.                Eyes: . Pupils are equal, round, and reactive to light. Conjunctivae and EOM are normal               Nose: without d/c or deformity               Neck: Neck supple. Gross normal ROM               Cardiovascular: Normal rate and regular rhythm.                 Pulmonary/Chest: Effort normal and breath sounds without rales or wheezing.                Abd:  Soft, NT, ND, + BS, no organomegaly               Neurological: Pt is alert. At baseline orientation, motor grossly intact               Skin: Skin is warm. No rashes, no other new lesions, LE edema - none               Psychiatric: Pt behavior is normal without agitation   Micro: none  Cardiac tracings I have personally interpreted today:  none  Pertinent Radiological findings (summarize): none   Lab Results  Component Value Date   WBC 6.4 12/13/2020   HGB 15.2 12/13/2020   HCT 45.5 12/13/2020   PLT 284.0 12/13/2020   GLUCOSE 87 12/13/2020   CHOL 170 12/13/2020   TRIG 50.0 12/13/2020   HDL 75.60 12/13/2020   LDLCALC 84 12/13/2020   ALT 16 12/13/2020   AST 20 12/13/2020   NA 140 12/13/2020   K 3.9 12/13/2020   CL 103 12/13/2020   CREATININE 0.74 12/13/2020   BUN 8 12/13/2020   CO2 29 12/13/2020   TSH 1.81 12/13/2020   PSA 1.23 12/13/2020   INR 1.0 03/26/2016   HGBA1C 5.6 12/13/2020   Assessment/Plan:  Keith Armstrong is a 55 y.o. White or Caucasian [1] male with  has a past medical history of B12 deficiency (12/13/2020), GERD (gastroesophageal reflux disease), Hepatitis C, HLD (hyperlipidemia) (05/29/2018), Laryngopharyngeal reflux (LPR), Psoriasis, STD (sexually transmitted disease), and Tinnitus.  Encounter for well adult exam with abnormal  findings Age and sex appropriate education and counseling updated with regular exercise and diet Referrals for preventative services -  none needed Immunizations addressed - declines covid vax and shingrix Smoking counseling  - pt counseled to quit, pt not ready Evidence for depression or other mood disorder - none significant Most recent labs reviewed. I have personally reviewed and have noted: 1) the patient's medical and social history 2) The patient's current medications and supplements 3) The patient's height, weight, and BMI have been recorded in the chart   Smoker Pt counseled to quit, pt not ready, ok for pulm referral for start in LDCT screening program  Hyperglycemia Lab Results  Component Value Date   HGBA1C 5.6 12/13/2020   Stable, pt to continue current medical treatment  - diet   HLD (hyperlipidemia) Lab Results  Component Value Date   LDLCALC 84 12/13/2020    pt to continue current low chol diet, declines statin, but to check card CT score, and consider statin for abormal calcium score as goal LDL would then be < 70   B12 deficiency Lab Results  Component Value Date   VITAMINB12 207 (L) 12/13/2020   Low, to start cont oral replacement - b12 1000 mcg qd   Chronic pain of right knee Uncontrolled pain, c/w probable djd, for sport med referral for possible cortison  Followup: Return in about 1 year (around 12/13/2021).  Oliver Barre, MD 12/13/2020 9:26 PM [facility]

## 2020-12-13 NOTE — Assessment & Plan Note (Signed)
Pt counseled to quit, pt not ready, ok for pulm referral for start in LDCT screening program

## 2020-12-14 ENCOUNTER — Encounter: Payer: Self-pay | Admitting: Internal Medicine
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
## 2020-12-25 ENCOUNTER — Ambulatory Visit (INDEPENDENT_AMBULATORY_CARE_PROVIDER_SITE_OTHER): Payer: No Typology Code available for payment source | Admitting: Family Medicine

## 2020-12-25 ENCOUNTER — Other Ambulatory Visit: Payer: Self-pay

## 2020-12-25 ENCOUNTER — Ambulatory Visit: Payer: Self-pay

## 2020-12-25 VITALS — BP 110/68 | HR 67 | Ht 70.0 in | Wt 140.0 lb

## 2020-12-25 DIAGNOSIS — G8929 Other chronic pain: Secondary | ICD-10-CM

## 2020-12-25 DIAGNOSIS — M25561 Pain in right knee: Secondary | ICD-10-CM

## 2020-12-25 NOTE — Patient Instructions (Signed)
Thank you for coming in today.   I've referred you to Physical Therapy.  Let us know if you don't hear from them in one week.   Please use Voltaren gel (Generic Diclofenac Gel) up to 4x daily for pain as needed.  This is available over-the-counter as both the name brand Voltaren gel and the generic diclofenac gel.   Quad strengthen exercises will help.  Typically avoid lunges and squats.   I think you have prepatellar bursitis and patellofemoral pain syndrome.   You also have some knee arthritis but that is not a dominant source of knee pain I think.    Recheck in about 2 months unless.

## 2020-12-26 ENCOUNTER — Encounter: Payer: Self-pay | Admitting: Family Medicine

## 2021-01-01 ENCOUNTER — Other Ambulatory Visit: Payer: Self-pay | Admitting: *Deleted

## 2021-01-01 DIAGNOSIS — F17210 Nicotine dependence, cigarettes, uncomplicated: Secondary | ICD-10-CM

## 2021-01-03 ENCOUNTER — Encounter: Payer: Self-pay | Admitting: Internal Medicine

## 2021-01-23 ENCOUNTER — Ambulatory Visit: Payer: No Typology Code available for payment source | Admitting: Rehabilitative and Restorative Service Providers"

## 2021-01-29 ENCOUNTER — Telehealth (INDEPENDENT_AMBULATORY_CARE_PROVIDER_SITE_OTHER): Payer: No Typology Code available for payment source | Admitting: Acute Care

## 2021-01-29 ENCOUNTER — Ambulatory Visit (INDEPENDENT_AMBULATORY_CARE_PROVIDER_SITE_OTHER)
Admission: RE | Admit: 2021-01-29 | Discharge: 2021-01-29 | Disposition: A | Discharge status: Home / Self Care | Payer: No Typology Code available for payment source | Source: Ambulatory Visit | Attending: Cardiology | Admitting: Cardiology

## 2021-01-29 ENCOUNTER — Other Ambulatory Visit: Payer: Self-pay

## 2021-01-29 ENCOUNTER — Encounter: Payer: Self-pay | Admitting: Acute Care

## 2021-01-29 DIAGNOSIS — F17210 Nicotine dependence, cigarettes, uncomplicated: Secondary | ICD-10-CM

## 2021-01-29 NOTE — Patient Instructions (Signed)
Thank you for participating in the [facility]. It was our pleasure to meet you today. We will call you with the results of your scan within the next few days. Your scan will be assigned a Lung RADS category score by the physicians reading the scans.  This Lung RADS score determines follow up scanning.  See below for description of categories, and follow up screening recommendations. We will be in touch to schedule your follow up screening annually or based on recommendations of our providers. We will fax a copy of your scan results to your Primary Care Physician, or the physician who referred you to the program, to ensure they have the results. Please call the office if you have any questions or concerns regarding your scanning experience or results.  Our office number is [phone]. Please speak with Denise Phelps, RN. She is our Lung Cancer Screening RN. If she is unavailable when you call, please have the office staff send her a message. She will return your call at her earliest convenience. Remember, if your scan is normal, we will scan you annually as long as you continue to meet the criteria for the program. (Age 53-77, Current smoker or smoker who has quit within the last 15 years). If you are a smoker, remember, quitting is the single most powerful action that you can take to decrease your risk of lung cancer and other pulmonary, breathing related problems. We know quitting is hard, and we are here to help.  Please let us know if there is anything we can do to help you meet your goal of quitting. If you are a former smoker, congratulations. We are proud of you! Remain smoke free! Remember you can refer friends or family members through the number above.  We will screen them to make sure they meet criteria for the program. Thank you for helping us take better care of you by participating in Lung Screening.  Lung RADS Categories:  Lung RADS 1: no nodules  or definitely non-concerning nodules.  Recommendation is for a repeat annual scan in 12 months.  Lung RADS 2:  nodules that are non-concerning in appearance and behavior with a very low likelihood of becoming an active cancer. Recommendation is for a repeat annual scan in 12 months.  Lung RADS 3: nodules that are probably non-concerning , includes nodules with a low likelihood of becoming an active cancer.  Recommendation is for a 6-month repeat screening scan. Often noted after an upper respiratory illness. We will be in touch to make sure you have no questions, and to schedule your 6-month scan.  Lung RADS 4 A: nodules with concerning findings, recommendation is most often for a follow up scan in 3 months or additional testing based on our provider's assessment of the scan. We will be in touch to make sure you have no questions and to schedule the recommended 3 month follow up scan.  Lung RADS 4 B:  indicates findings that are concerning. We will be in touch with you to schedule additional diagnostic testing based on our provider's  assessment of the scan.   

## 2021-01-29 NOTE — Progress Notes (Addendum)
Virtual Visit via Video Note  I connected with Keith Armstrong on 02/11/21 at  3:00 PM EDT by a video enabled telemedicine application and verified that I am speaking with the correct person using two identifiers.  Location: Patient: At home Provider: [address] [place], [place], [address]    I discussed the limitations of evaluation and management by telemedicine and the availability of in person appointments. The patient expressed understanding and agreed to proceed.  Shared Decision Making Visit Lung Cancer Screening Program [phone])   Eligibility: Age 55 y.o. Pack Years Smoking History Calculation 29 pack year smoking hisotry (# packs/per year x # years smoked) Recent History of coughing up blood  no Unexplained weight loss? no ( >Than 15 pounds within the last 6 months ) Prior History Lung / other cancer no (Diagnosis within the last 5 years already requiring surveillance chest CT Scans). Smoking Status Current Smoker Former Smokers: Years since quit: NA  Quit Date: NA  Visit Components: Discussion included one or more decision making aids. yes Discussion included risk/benefits of screening. yes Discussion included potential follow up diagnostic testing for abnormal scans. yes Discussion included meaning and risk of over diagnosis. yes Discussion included meaning and risk of False Positives. yes Discussion included meaning of total radiation exposure. yes  Counseling Included: Importance of adherence to annual lung cancer LDCT screening. yes Impact of comorbidities on ability to participate in the program. yes Ability and willingness to under diagnostic treatment. yes  Smoking Cessation Counseling: Current Smokers:  Discussed importance of smoking cessation. yes Information about tobacco cessation classes and interventions provided to patient. yes Patient provided with "ticket" for LDCT Scan. yes Symptomatic Patient. no  Counseling Diagnosis Code: Tobacco  Use Z72.0 Asymptomatic Patient yes  Counseling (Intermediate counseling: > three minutes counseling) F6812 Former Smokers:  Discussed the importance of maintaining cigarette abstinence. yes Diagnosis Code: Personal History of Nicotine Dependence. X51.700 Information about tobacco cessation classes and interventions provided to patient. Yes Patient provided with "ticket" for LDCT Scan. yes Written Order for Lung Cancer Screening with LDCT placed in Epic. Yes (CT Chest Lung Cancer Screening Low Dose W/O CM) FVC9449 Z12.2-Screening of respiratory organs Z87.891-Personal history of nicotine dependence  I spent 25 minutes of face to face time with Keith Armstrong discussing the risks and benefits of lung cancer screening. We viewed a power point together that explained in detail the above noted topics. We took the time to pause the power point at intervals to allow for questions to be asked and answered to ensure understanding. We discussed that he had taken the single most powerful action possible to decrease his risk of developing lung cancer when he quit smoking. I counseled him to remain smoke free, and to contact me if he ever had the desire to smoke again so that I can provide resources and tools to help support the effort to remain smoke free. We discussed the time and location of the scan, and that either  Keith Miyamoto RN or I will call with the results within  24-48 hours of receiving them. He has my card and contact information in the event he needs to speak with me, in addition to a copy of the power point we reviewed as a resource. He verbalized understanding of all of the above and had no further questions upon leaving the office.     I explained to the patient that there has been a high incidence of coronary artery disease noted on these exams.  I explained that this is a non-gated exam therefore degree or severity cannot be determined. This patient is not on statin therapy. I have asked the  patient to follow-up with their PCP regarding any incidental finding of coronary artery disease and management with diet or medication as they feel is clinically indicated. The patient verbalized understanding of the above and had no further questions.  I spent 3-4 minutes on smoking cessation counseling  I spent  30 minutes dedicated to the care of this patient on the date of this encounter to include pre-visit review of records, face-to-face time with the patient discussing conditions above, post visit ordering of testing, clinical documentation with the electronic health record, making appropriate referrals as documented, and communicating necessary information to the patient's healthcare team.      Bevelyn Ngo, NP 01/29/2021

## 2021-02-07 ENCOUNTER — Encounter: Payer: Self-pay | Admitting: *Deleted

## 2021-02-07 DIAGNOSIS — F17210 Nicotine dependence, cigarettes, uncomplicated: Secondary | ICD-10-CM

## 2021-12-12 ENCOUNTER — Emergency Department (HOSPITAL_COMMUNITY)
Admission: EM | Admit: 2021-12-12 | Discharge: 2021-12-12 | Disposition: A | Discharge status: Home / Self Care | Payer: BC Managed Care – PPO | Attending: Emergency Medicine | Admitting: Emergency Medicine

## 2021-12-12 ENCOUNTER — Encounter (HOSPITAL_COMMUNITY): Payer: Self-pay

## 2021-12-12 ENCOUNTER — Other Ambulatory Visit: Payer: Self-pay

## 2021-12-12 DIAGNOSIS — T161XXA Foreign body in right ear, initial encounter: Secondary | ICD-10-CM | POA: Insufficient documentation

## 2021-12-12 DIAGNOSIS — X58XXXA Exposure to other specified factors, initial encounter: Secondary | ICD-10-CM | POA: Insufficient documentation

## 2021-12-12 NOTE — ED Provider Notes (Signed)
[facility]-EMERGENCY DEPT Provider Note   CSN: [id number] Arrival date & time: 12/12/21  4034     History  Chief Complaint  Patient presents with   Foreign Body in Ear    Keith Armstrong is a 56 y.o. male.  56 year old male presents today for evaluation of foreign body in his right ear.  States this occurred about 12 hours ago.  Denies other complaints.  The history is provided by the patient. No language interpreter was used.       Home Medications Prior to Admission medications   Medication Sig Start Date End Date Taking? Authorizing Provider  CLOBETASOL PROP EMOLLIENT BASE EX Apply topically. Patient not taking: No sig reported    [provider]  EPINEPHrine 0.3 mg/0.3 mL IJ SOAJ injection Inject 0.3 mLs (0.3 mg total) into the muscle as needed for anaphylaxis. Patient not taking: No sig reported 01/04/20   Corwin Levins, MD  ibuprofen (ADVIL) 800 MG tablet Take 1 tablet (800 mg total) by mouth every 8 (eight) hours as needed. Patient not taking: No sig reported 07/20/19   Vivi Barrack, DPM  traMADol (ULTRAM) 50 MG tablet Take 1 tablet (50 mg total) by mouth every 12 (twelve) hours as needed. Patient not taking: No sig reported 07/27/19   Vivi Barrack, DPM      Allergies    Bee venom    Review of Systems   Review of Systems  HENT:  Negative for ear discharge and ear pain.   All other systems reviewed and are negative.   Physical Exam Updated Vital Signs BP 110/81 (BP Location: Left Arm)   Pulse 99   Temp 98.2 F (36.8 C) (Oral)   Resp 17   Ht 5\' 10"  (1.778 m)   Wt 63.5 kg   SpO2 95%   BMI 20.09 kg/m  Physical Exam Vitals and nursing note reviewed.  Constitutional:      General: He is not in acute distress.    Appearance: Normal appearance. He is not ill-appearing.  HENT:     Head: Normocephalic and atraumatic.     Ears:     Comments: Moderate wax buildup noted in the right ear.  Flapping of what is believed to  be a wing of a bug is noted behind the earwax.    Nose: Nose normal.  Eyes:     Conjunctiva/sclera: Conjunctivae normal.  Pulmonary:     Effort: Pulmonary effort is normal. No respiratory distress.  Musculoskeletal:        General: No deformity.  Skin:    Findings: No rash.  Neurological:     Mental Status: He is alert.     ED Results / Procedures / Treatments   Labs (all labs ordered are listed, but only abnormal results are displayed) Labs Reviewed - No data to display  EKG None  Radiology No results found.  Procedures Procedures    Medications Ordered in ED Medications - No data to display  ED Course/ Medical Decision Making/ A&P                           Medical Decision Making  Patient presents for evaluation of foreign body in right ear.  During our conversation patient received a call back from his ENT who offered patient evaluation in her clinic today as a walk-in.  Patient chooses to proceed with evaluation at ENT clinic.  Patient discharged.   Final  Clinical Impression(s) / ED Diagnoses Final diagnoses:  Foreign body of right ear, initial encounter    Rx / DC Orders ED Discharge Orders     None         Marita Kansas, PA-C 12/12/21 0941    Derwood Kaplan, MD 12/12/21 1547

## 2021-12-12 NOTE — ED Triage Notes (Signed)
Pt states he has had a bug in his right ear for almost 12 hours.

## 2022-01-01 ENCOUNTER — Encounter: Payer: Self-pay | Admitting: Internal Medicine

## 2022-01-01 MED ORDER — EPINEPHRINE 0.3 MG/0.3ML IJ SOAJ: 0.3000 mg | INTRAMUSCULAR | 1 refills | Status: AC | PRN

## 2022-01-29 ENCOUNTER — Ambulatory Visit (HOSPITAL_BASED_OUTPATIENT_CLINIC_OR_DEPARTMENT_OTHER)
Admission: RE | Admit: 2022-01-29 | Discharge: 2022-01-29 | Disposition: A | Discharge status: Home / Self Care | Payer: BC Managed Care – PPO | Source: Ambulatory Visit | Attending: Acute Care | Admitting: Acute Care

## 2022-01-29 DIAGNOSIS — Z122 Encounter for screening for malignant neoplasm of respiratory organs: Secondary | ICD-10-CM | POA: Insufficient documentation

## 2022-01-29 DIAGNOSIS — J439 Emphysema, unspecified: Secondary | ICD-10-CM | POA: Diagnosis not present

## 2022-01-29 DIAGNOSIS — F17210 Nicotine dependence, cigarettes, uncomplicated: Secondary | ICD-10-CM | POA: Insufficient documentation

## 2022-01-29 DIAGNOSIS — I700 Atherosclerosis of aorta: Secondary | ICD-10-CM | POA: Insufficient documentation

## 2022-01-31 ENCOUNTER — Other Ambulatory Visit: Payer: Self-pay

## 2022-01-31 DIAGNOSIS — Z87891 Personal history of nicotine dependence: Secondary | ICD-10-CM

## 2022-01-31 DIAGNOSIS — Z122 Encounter for screening for malignant neoplasm of respiratory organs: Secondary | ICD-10-CM

## 2022-01-31 DIAGNOSIS — F17210 Nicotine dependence, cigarettes, uncomplicated: Secondary | ICD-10-CM

## 2022-04-04 ENCOUNTER — Ambulatory Visit (INDEPENDENT_AMBULATORY_CARE_PROVIDER_SITE_OTHER): Payer: BC Managed Care – PPO

## 2022-04-04 ENCOUNTER — Ambulatory Visit (INDEPENDENT_AMBULATORY_CARE_PROVIDER_SITE_OTHER): Payer: BC Managed Care – PPO | Admitting: Orthopaedic Surgery

## 2022-04-04 DIAGNOSIS — M545 Low back pain, unspecified: Secondary | ICD-10-CM

## 2022-04-04 MED ORDER — DICLOFENAC SODIUM 75 MG PO TBEC: 75.0000 mg | DELAYED_RELEASE_TABLET | Freq: Two times a day (BID) | ORAL | 2 refills | Status: AC | PRN

## 2022-04-04 MED ORDER — CYCLOBENZAPRINE HCL 10 MG PO TABS: 10.0000 mg | ORAL_TABLET | Freq: Three times a day (TID) | ORAL | 0 refills | Status: AC | PRN

## 2022-04-04 MED ORDER — PREDNISONE 10 MG (21) PO TBPK: ORAL_TABLET | ORAL | 0 refills | Status: DC

## 2022-04-04 NOTE — Progress Notes (Signed)
Office Visit Note   Patient: Keith Armstrong           Date of Birth: 02-13-1966           MRN: [id number] Visit Date: 04/04/2022              Requested by: Corwin Levins, MD [address] [place],  [place] [address] PCP: Corwin Levins, MD   Assessment & Plan: Visit Diagnoses:  1. Low back pain, unspecified back pain laterality, unspecified chronicity, unspecified whether sciatica present     Plan: Impression is left lower extremity radiculopathy likely from his lumbar spine.  At this point, we have discussed starting him on a steroid pack and muscle relaxers followed by anti-inflammatories in addition to a course of formal physical therapy.  He is agreeable to this plan.  If his symptoms do not improve over the next few months or happen to worsen in the meantime, we will get an MRI to assess for bulging disc.  He will follow-up with [place] as needed.  Call with concerns or questions.    Follow-Up Instructions: Return if symptoms worsen or fail to improve.   Orders:  Orders Placed This Encounter  Procedures   XR Lumbar Spine 2-3 Views   Meds ordered this encounter  Medications   predniSONE (STERAPRED UNI-PAK 21 TAB) 10 MG (21) TBPK tablet    Sig: Take as directed    Dispense:  21 tablet    Refill:  0   cyclobenzaprine (FLEXERIL) 10 MG tablet    Sig: Take 1 tablet (10 mg total) by mouth 3 (three) times daily as needed for muscle spasms.    Dispense:  30 tablet    Refill:  0   diclofenac (VOLTAREN) 75 MG EC tablet    Sig: Take 1 tablet (75 mg total) by mouth 2 (two) times daily as needed. Do not start taking until finished with steroid pack    Dispense:  60 tablet    Refill:  2      Procedures: No procedures performed   Clinical Data: No additional findings.   Subjective: Chief Complaint  Patient presents with   Lower Back - Pain    HPI patient is a pleasant 56 year old gentleman who comes in today with left buttock and left leg pain for the past month.  He  was picking up a heavy cooler of ice when he twisted and lifted up on the back of a truck.  He started to experience pain the following day.  The pain is a 6 sharp stabbing pain to the left buttock with radiation down the back of his leg to his knee.  He notes occasional tingling to the left foot.  Symptoms appear to be worse when he is sitting down as well as the first time in the morning with first few steps he takes.  He does get some improvement after he has been walking for a period of time.  He denies any weakness to the left lower extremity.  No bowel or bladder change or saddle paresthesias.  He has taken tramadol on occasion which does seem to help.  No previous lumbar pathology.  Review of Systems as detailed in HPI.  All others reviewed and are negative.   Objective: Vital Signs: There were no vitals taken for this visit.  Physical Exam well-developed well-nourished gentleman in no acute distress.  Alert and oriented x3.  Ortho Exam left lumbar spine exam: He does have moderate tenderness  to the sciatic notch.  No tenderness to the proximal hamstring attachment.  Markedly positive straight leg raise.  He has increased pain with lumbar extension.  Does get some relief with lumbar flexion.  No focal weakness.  He is neurovascular intact distally.  Specialty Comments:  No specialty comments available.  Imaging: XR Lumbar Spine 2-3 Views  Result Date: 04/04/2022 Moderate degenerative changes L5-S1    PMFS History: Patient Active Problem List   Diagnosis Date Noted   Smoker 12/13/2020   B12 deficiency 12/13/2020   Chronic pain of right knee 12/13/2020   Hallux rigidus of left foot 04/12/2019   Febrile illness 11/26/2018   Hyperglycemia 05/29/2018   HLD (hyperlipidemia) 05/29/2018   Encounter for well adult exam with abnormal findings 02/12/2017   Family history of colon cancer 02/12/2017   Right ankle pain 11/01/2013   Excess ear wax 11/01/2013   BRADYCARDIA 03/13/2010    HEPATITIS C 01/03/2010   Right bundle branch block 01/03/2010   CHEST DISCOMFORT 01/03/2010   Past Medical History:  Diagnosis Date   B12 deficiency 12/13/2020   GERD (gastroesophageal reflux disease)    Hepatitis C    non reactive   HLD (hyperlipidemia) 05/29/2018   Laryngopharyngeal reflux (LPR)    Psoriasis    STD (sexually transmitted disease)    Tinnitus     Family History  Problem Relation Age of Onset   Hypertension Mother    Lung cancer Mother    Cancer - Lung Mother    Heart disease Maternal Grandmother    Heart attack Maternal Grandmother    Other Father        [place] Mt spotted fever   Cancer Brother        type unknown   Diabetes Maternal Uncle    Kidney failure Maternal Uncle    Colon cancer Neg Hx    Colon polyps Neg Hx    Rectal cancer Neg Hx    Stomach cancer Neg Hx    Esophageal cancer Neg Hx     Past Surgical History:  Procedure Laterality Date   COLONOSCOPY     INGUINAL HERNIA REPAIR Bilateral    and distended testicle   TRIGGER FINGER RELEASE Left 07/30/2018   Procedure: RELEASE TRIGGER FINGER/A-1 PULLEY;  Surgeon: Cindee Salt, MD;  Location: [facility];  Service: Orthopedics;  Laterality: Left;   Social History   Occupational History   Occupation: Agricultural engineer: [employer]   Occupation: Psychologist, sport and exercise    Comment: Bartending business  Tobacco Use   Smoking status: Every Day    Packs/day: 1.00    Years: 29.00    Total pack years: 29.00    Types: Cigarettes   Smokeless tobacco: Never  Vaping Use   Vaping Use: Never used  Substance and Sexual Activity   Alcohol use: Not Currently    Alcohol/week: 4.0 standard drinks of alcohol    Types: 4 Glasses of wine per week   Drug use: Not Currently    Types: Cocaine, Marijuana    Comment: last use was 03/2006   Sexual activity: Not on file

## 2022-04-16 ENCOUNTER — Ambulatory Visit (INDEPENDENT_AMBULATORY_CARE_PROVIDER_SITE_OTHER): Payer: BC Managed Care – PPO | Admitting: Physical Therapy

## 2022-04-16 ENCOUNTER — Encounter: Payer: Self-pay | Admitting: Physical Therapy

## 2022-04-16 DIAGNOSIS — R262 Difficulty in walking, not elsewhere classified: Secondary | ICD-10-CM

## 2022-04-16 DIAGNOSIS — M6281 Muscle weakness (generalized): Secondary | ICD-10-CM

## 2022-04-16 DIAGNOSIS — M5459 Other low back pain: Secondary | ICD-10-CM | POA: Diagnosis not present

## 2022-04-16 NOTE — Therapy (Addendum)
OUTPATIENT PHYSICAL THERAPY THORACOLUMBAR EVALUATION /DISCHARGE   Patient Name: Keith Armstrong MRN: [id number] DOB:Oct 12, 1965, 56 y.o., male Today's Date: 04/16/2022  END OF SESSION:  PT End of Session - 04/16/22 1530     Visit Number 1    Number of Visits 16    Date for PT Re-Evaluation 06/14/22    PT Start Time 1433    PT Stop Time 1516    PT Time Calculation (min) 43 min    Activity Tolerance Patient tolerated treatment well    Behavior During Therapy [facility] for tasks assessed/performed             Past Medical History:  Diagnosis Date   B12 deficiency 12/13/2020   GERD (gastroesophageal reflux disease)    Hepatitis C    non reactive   HLD (hyperlipidemia) 05/29/2018   Laryngopharyngeal reflux (LPR)    Psoriasis    STD (sexually transmitted disease)    Tinnitus    Past Surgical History:  Procedure Laterality Date   COLONOSCOPY     INGUINAL HERNIA REPAIR Bilateral    and distended testicle   TRIGGER FINGER RELEASE Left 07/30/2018   Procedure: RELEASE TRIGGER FINGER/A-1 PULLEY;  Surgeon: Cindee Salt, MD;  Location: [facility];  Service: Orthopedics;  Laterality: Left;   Patient Active Problem List   Diagnosis Date Noted   Smoker 12/13/2020   B12 deficiency 12/13/2020   Chronic pain of right knee 12/13/2020   Hallux rigidus of left foot 04/12/2019   Febrile illness 11/26/2018   Hyperglycemia 05/29/2018   HLD (hyperlipidemia) 05/29/2018   Encounter for well adult exam with abnormal findings 02/12/2017   Family history of colon cancer 02/12/2017   Right ankle pain 11/01/2013   Excess ear wax 11/01/2013   BRADYCARDIA 03/13/2010   HEPATITIS C 01/03/2010   Right bundle branch block 01/03/2010   CHEST DISCOMFORT 01/03/2010    PCP:  Corwin Levins, MD   REFERRING PROVIDER: Cristie Hem, PA-C  REFERRING DIAG: M54.50 (ICD-10-CM) - Low back pain, unspecified back pain laterality, unspecified chronicity, unspecified whether sciatica  present  Rationale for Evaluation and Treatment: Rehabilitation  THERAPY DIAG:  Other low back pain  Difficulty in walking, not elsewhere classified  Muscle weakness (generalized)  ONSET DATE: a few months ago when lifting something heavy  SUBJECTIVE:                                                                                                                                                                                           SUBJECTIVE STATEMENT: Pt arriving today reporting low back pain which runs into left glutes and left  anterior hip. Pt stating he is concerned with doing the wrong thing so he has waited to attend therapy before trying any exercises or stretches. Pt stating he walks his dogs twice a day about 3 miles both times. Pt stating once he begins walking his pain decreases.   PERTINENT HISTORY:  Vit B12 deficiency, Hep C, HLD, Psoriasis, GERD, tinnitus, inguinal repair, trigger finger release.   PAIN:  NPRS scale: 1/10, pain can get to 6/10 first thing in morning Pain location: low back on left side Pain description: stabbing pain at times Aggravating factors: sitting prolonged Relieving factors: walking  PRECAUTIONS: None  WEIGHT BEARING RESTRICTIONS: No  FALLS:  Has patient fallen in last 6 months? 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, sitealarm system installation)  PLOF: Independent  PATIENT GOALS: sit without pain, wake up without pain  OBJECTIVE:   DIAGNOSTIC FINDINGS:  04/04/22:  Moderate degenerative changes L5-S1   PATIENT SURVEYS:  04/16/22: FOTO eval:     SCREENING FOR RED FLAGS: Bowel or bladder incontinence: No Cauda equina syndrome: No  COGNITION: Overall cognitive status: WFL normal      SENSATION: 04/16/22: WFL  MUSCLE  LENGTH: 04/16/22:  Hamstrings: Right 76 deg; Left 68 deg   POSTURE:  04/16/22: rounded shoulders and forward head  PALPATION: 04/16/22: TTP: left lumbar paraspinals, left QL, and left piriformis  LUMBAR ROM:   AROM 04/16/22:   Flexion 85 deg (finger tips to ankles)  Extension 30 deg  Right lateral flexion 32  Left lateral flexion 36  Right rotation Limited 25%  Left rotation WFL   (Blank rows = not tested)  LOWER EXTREMITY ROM:     Active  Right 04/16/22 Left 04/16/22  Hip flexion 112 104  Hip extension    Hip abduction    Hip adduction    Hip internal rotation    Hip external rotation    Knee flexion    Knee extension    Ankle dorsiflexion    Ankle plantarflexion    Ankle inversion    Ankle eversion     (Blank rows = not tested)  LOWER EXTREMITY MMT:    MMT Right 04/16/22 Left 04/16/22  Hip flexion 5/5 5/5  Hip extension 5/5 5/5  Hip abduction 5/5 5/5  Hip adduction 5/5 5/5  Hip internal rotation    Hip external rotation    Knee flexion 5/5 5/5  Knee extension 5/5 5/5  Ankle dorsiflexion    Ankle plantarflexion    Ankle inversion    Ankle eversion     (Blank rows = not tested)  LUMBAR SPECIAL TESTS:  04/16/22: Slump test: Positive left   FUNCTIONAL TESTS:  04/16/22: 5 times sit to stand: 19 seconds no UE suport  GAIT: Distance walked: 30 feet level surface Assistive device utilized: None Level of assistance: Complete Independence   TODAY'S TREATMENT:  DATE:  04/16/22:   Therex: HEP instruction/performance c cues for techniques, handout provided.  Trial set performed of each for comprehension and symptom assessment.  See below for exercise list  PATIENT EDUCATION:  Education details: HEP, POC Person educated: Patient Education method: Explanation, Demonstration, Verbal cues, and Handouts Education comprehension:  verbalized understanding, returned demonstration, and verbal cues required  HOME EXERCISE PROGRAM: Access Code: [email or web address] URL: [email or web address] Date: 04/16/2022 Prepared by: Narda Amber  Exercises - Hooklying Hamstring Stretch with Strap  - 2-3 x daily - 7 x weekly - 3 reps - 20 seconds hold - Supine Figure 4 Piriformis Stretch  - 2-3 x daily - 7 x weekly - 3-5 reps - 20 seconds hold - Supine Piriformis Stretch with Foot on Ground  - 2-3 x daily - 7 x weekly - 3-5 reps - 20 seconds hold - Supine Lower Trunk Rotation  - 2-3 x daily - 7 x weekly - 3-5 reps - 20 seconds hold - Standing Thoracic Extension at Wall  - 2-3 x daily - 7 x weekly - 10 reps - 10 seconds hold  ASSESSMENT:  CLINICAL IMPRESSION: Patient is a 56 y.o. who comes to clinic with complaints of low back pain with mobility, strength and movement coordination deficits that impair their ability to perform usual daily and recreational functional activities without increase difficulty/symptoms at this time.  Pain could be replicated with active trigger point release on left piriformis. Following demonstration of pt's HEP, pt was able to transition from supine to sit with more ease. Patient to benefit from skilled PT services to address impairments and limitations to improve to previous level of function without restriction secondary to condition.   OBJECTIVE IMPAIRMENTS: decreased activity tolerance, decreased mobility, difficulty walking, decreased ROM, increased muscle spasms, and pain.   ACTIVITY LIMITATIONS: bending, sitting, squatting, and sleeping  PARTICIPATION LIMITATIONS: community activity and occupation  PERSONAL FACTORS: see above are also affecting patient's functional outcome.   REHAB POTENTIAL: Good  CLINICAL DECISION MAKING: Evolving/moderate complexity  EVALUATION COMPLEXITY: Low   GOALS: Goals reviewed with patient? Yes  SHORT TERM GOALS: (target date for Short term goals are 3  weeks 05/10/22)  1. Patient will demonstrate independent use of home exercise program to maintain progress from in clinic treatments.  Goal status: New  LONG TERM GOALS: (target dates for all long term goals are 8 weeks  06/14/22 )   1. Patient will demonstrate/report pain at worst less than or equal to 2/10 to facilitate minimal limitation in daily activity secondary to pain symptoms.  Goal status: New   2. Patient will demonstrate independent use of home exercise program to facilitate ability to maintain/progress functional gains from skilled physical therapy services.  Goal status: New   3. Patient will demonstrate FOTO outcome > or = 73 % to indicate reduced disability due to condition.  Goal status: New   4. Patient will be able to sit for 1 hour with pain </= 2/10 for work or driving related activities.   Goal status: New   5.  Pt will be able to improve his right trunk rotation to [facility] for improved functional mobility Goal status: New   6.  Pt will be able to pick up 20# object from floor and place on overhead shelf using correct body mechanics with no pain.   Goal status: New     PLAN:  PT FREQUENCY: 1-2x/week  PT DURATION: 10 weeks  PLANNED INTERVENTIONS: Therapeutic exercises, Therapeutic activity, Neuro Muscular re-education, Balance training,  Gait training, Patient/Family education, Joint mobilization, Stair training, DME instructions, Dry Needling, Electrical stimulation, Cryotherapy, vasopneumatic device, Moist heat, Taping, Traction Ultrasound, Ionotophoresis 4mg /ml Dexamethasone, and Manual therapy.  All included unless contraindicated  PLAN FOR NEXT SESSION: Review HEP knowledge/results, Nustep, lumbar mobs, core strengthening, consider DN, consider traction    , PT, MPT 04/16/2022, 3:31 PM    PHYSICAL THERAPY DISCHARGE SUMMARY  Visits from Start of Care: 1  Current functional level related to goals / functional outcomes: See  note   Remaining deficits: See note   Education / Equipment: HEP  Patient goals were not met. Patient is being discharged due to not returning since the last visit.  04/18/2022, PT, DPT, OCS, ATC 06/13/22  3:19 PM

## 2022-08-29 ENCOUNTER — Encounter: Payer: Self-pay | Admitting: Internal Medicine

## 2022-10-19 IMAGING — DX DG CHEST 2V
2 series · 2 of 2 positions shown · non-contrast
Comparison: No prior.

CLINICAL DATA: Smoker.  Cough.

EXAM:
CHEST - 2 VIEW

[chest pa]
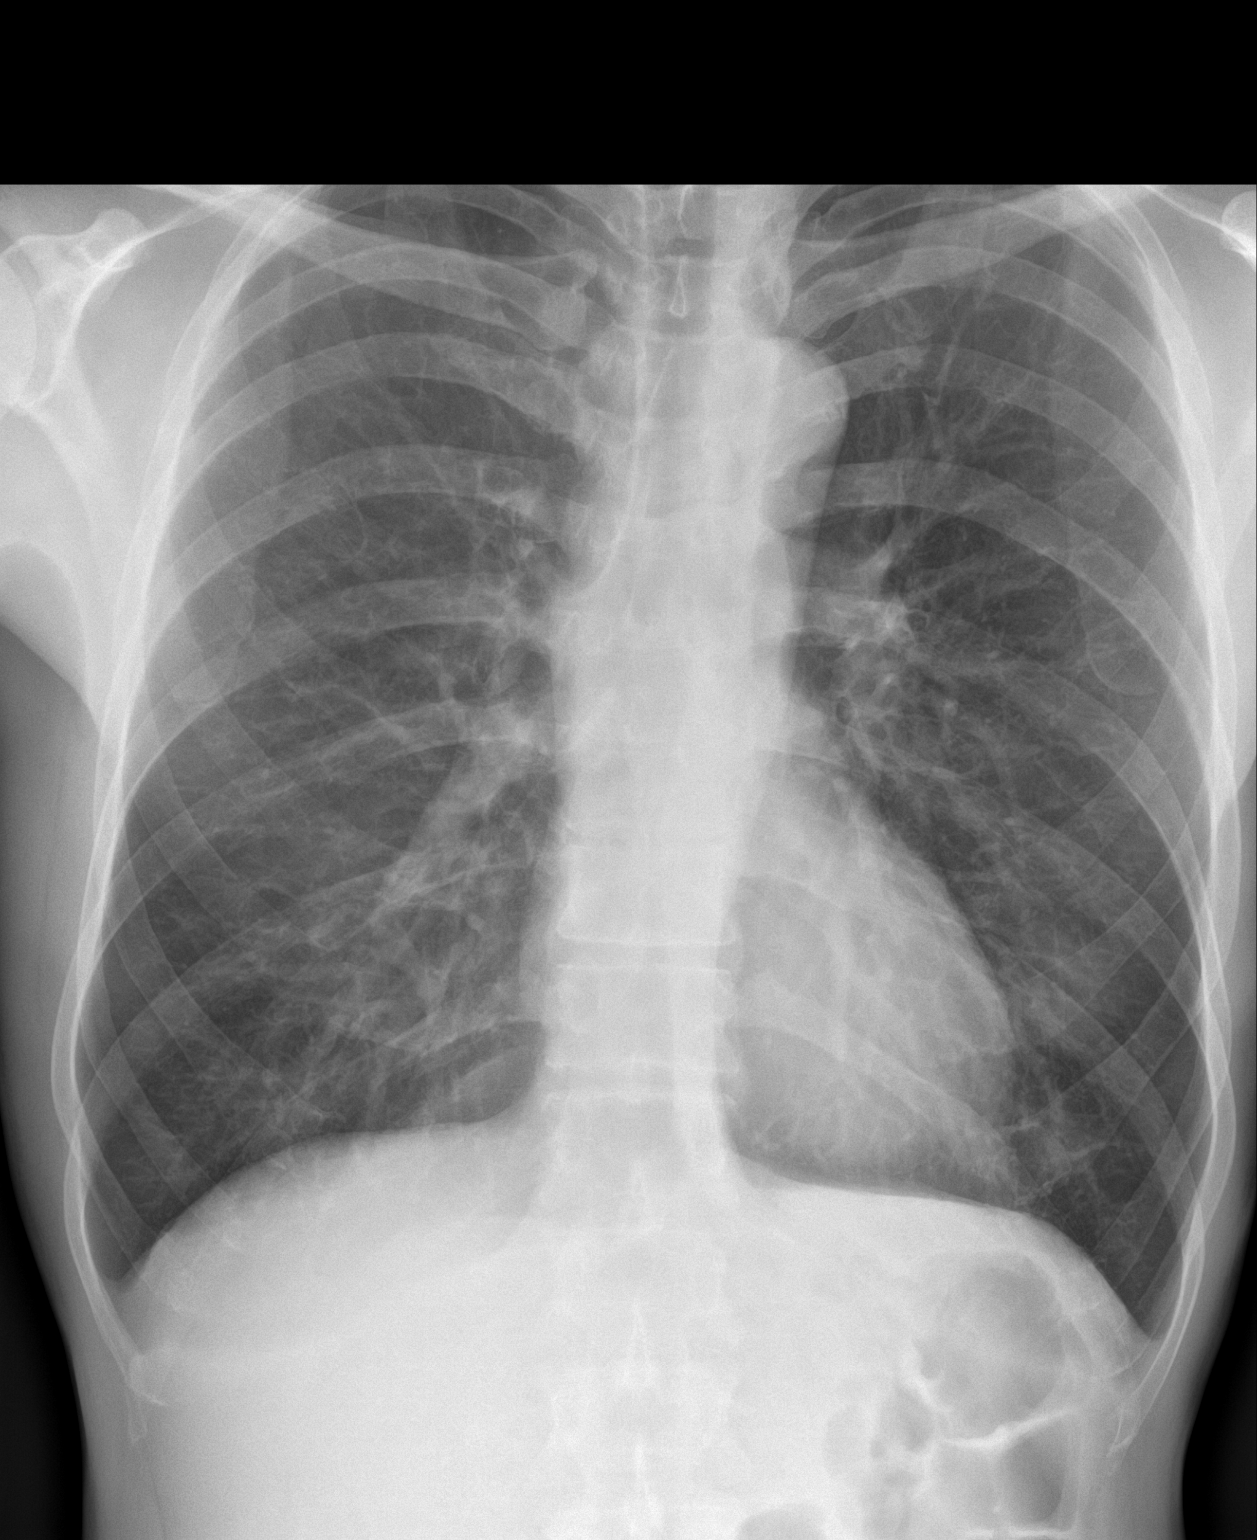

[chest lat]
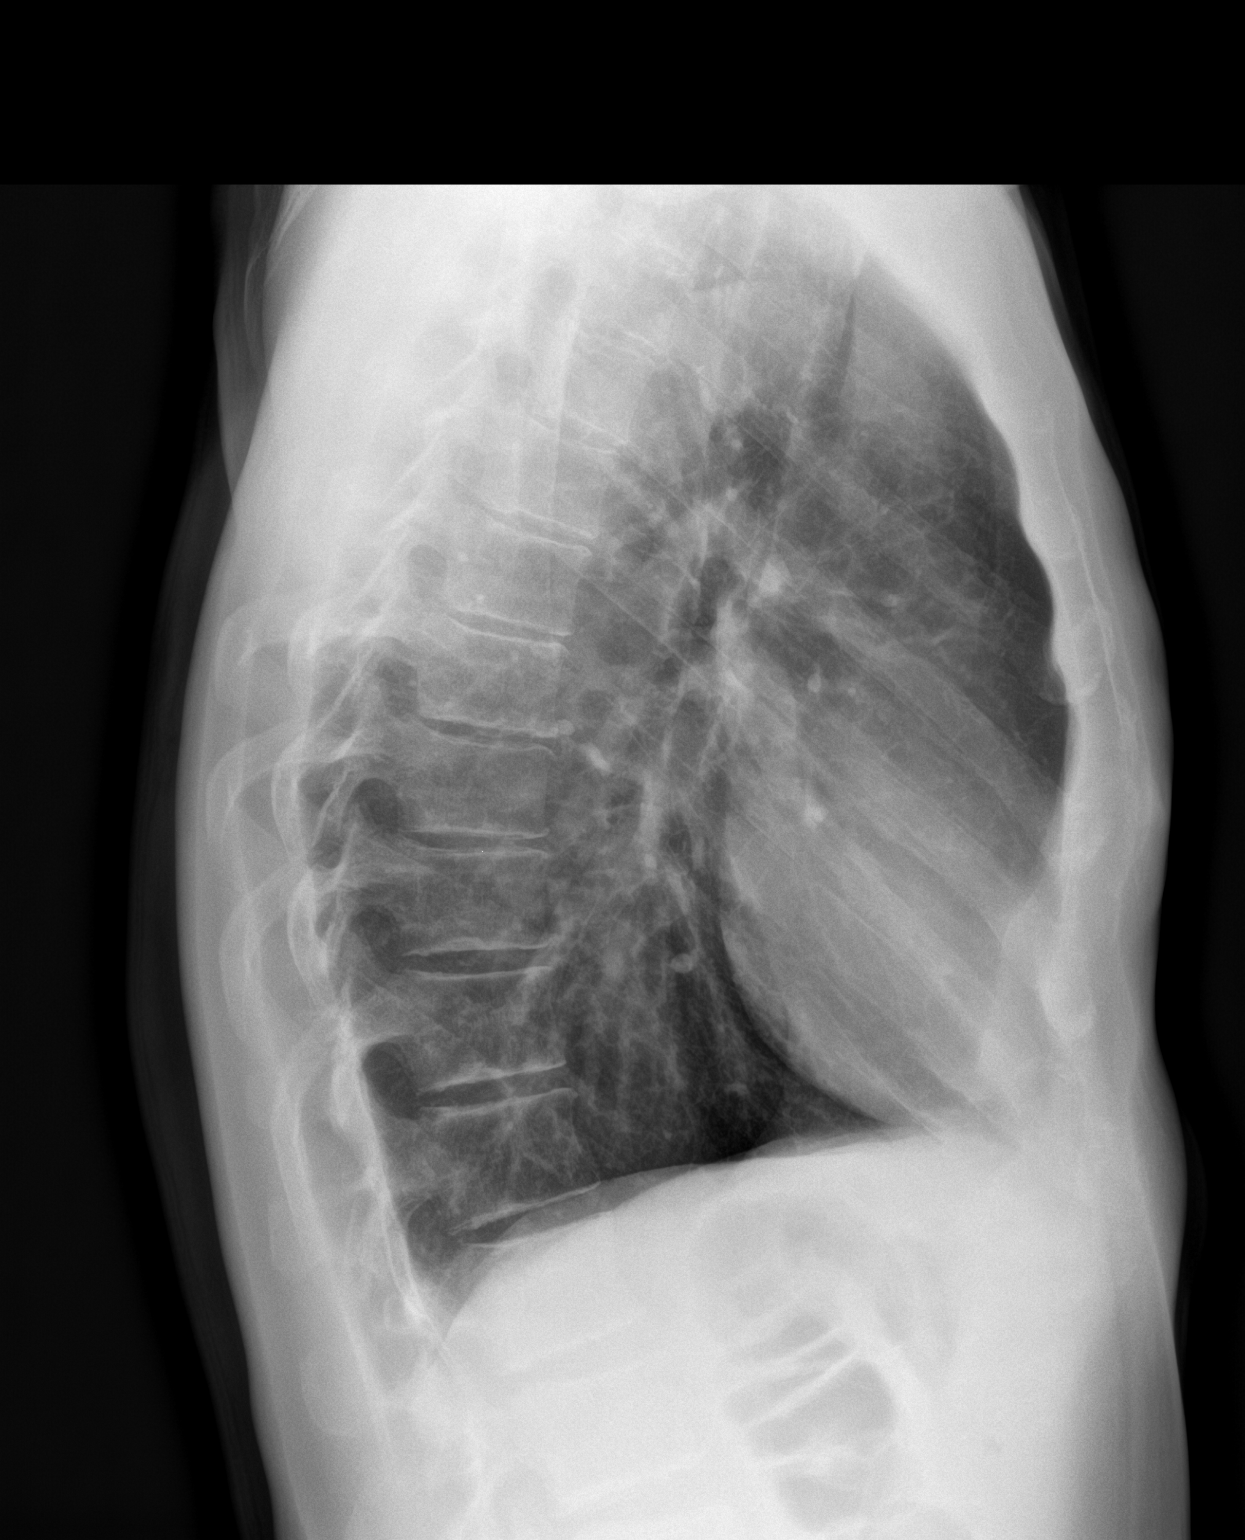

[2 of 2 positions shown; findings below may reference images not displayed]

FINDINGS: Mediastinum and hilar structures normal. Lungs are clear. No pleural
effusion or pneumothorax. Heart size normal.
IMPRESSION: No acute cardiopulmonary disease.
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
## 2023-01-31 ENCOUNTER — Ambulatory Visit (HOSPITAL_BASED_OUTPATIENT_CLINIC_OR_DEPARTMENT_OTHER)
Admission: RE | Admit: 2023-01-31 | Discharge: 2023-01-31 | Disposition: A | Discharge status: Home / Self Care | Payer: BC Managed Care – PPO | Source: Ambulatory Visit | Attending: Acute Care | Admitting: Acute Care

## 2023-01-31 DIAGNOSIS — Z87891 Personal history of nicotine dependence: Secondary | ICD-10-CM | POA: Insufficient documentation

## 2023-01-31 DIAGNOSIS — Z122 Encounter for screening for malignant neoplasm of respiratory organs: Secondary | ICD-10-CM | POA: Insufficient documentation

## 2023-01-31 DIAGNOSIS — F17210 Nicotine dependence, cigarettes, uncomplicated: Secondary | ICD-10-CM | POA: Insufficient documentation

## 2023-02-13 ENCOUNTER — Other Ambulatory Visit: Payer: Self-pay

## 2023-02-13 DIAGNOSIS — Z122 Encounter for screening for malignant neoplasm of respiratory organs: Secondary | ICD-10-CM

## 2023-02-13 DIAGNOSIS — Z87891 Personal history of nicotine dependence: Secondary | ICD-10-CM

## 2023-02-13 DIAGNOSIS — F17210 Nicotine dependence, cigarettes, uncomplicated: Secondary | ICD-10-CM

## 2024-02-11 ENCOUNTER — Ambulatory Visit (HOSPITAL_BASED_OUTPATIENT_CLINIC_OR_DEPARTMENT_OTHER)
Admission: RE | Admit: 2024-02-11 | Discharge: 2024-02-11 | Disposition: A | Discharge status: Home / Self Care | Source: Ambulatory Visit | Attending: Internal Medicine | Admitting: Internal Medicine

## 2024-02-11 DIAGNOSIS — Z87891 Personal history of nicotine dependence: Secondary | ICD-10-CM | POA: Insufficient documentation

## 2024-02-11 DIAGNOSIS — Z122 Encounter for screening for malignant neoplasm of respiratory organs: Secondary | ICD-10-CM | POA: Insufficient documentation

## 2024-02-11 DIAGNOSIS — F17210 Nicotine dependence, cigarettes, uncomplicated: Secondary | ICD-10-CM | POA: Insufficient documentation

## 2024-02-19 ENCOUNTER — Other Ambulatory Visit: Payer: Self-pay

## 2024-03-31 ENCOUNTER — Ambulatory Visit: Admitting: Internal Medicine

## 2024-03-31 ENCOUNTER — Encounter: Payer: Self-pay | Admitting: Internal Medicine

## 2024-03-31 ENCOUNTER — Ambulatory Visit: Payer: Self-pay | Admitting: Internal Medicine

## 2024-03-31 ENCOUNTER — Other Ambulatory Visit (HOSPITAL_BASED_OUTPATIENT_CLINIC_OR_DEPARTMENT_OTHER): Payer: Self-pay

## 2024-03-31 VITALS — BP 132/68 | HR 90 | Temp 95.0°F | Ht 70.0 in | Wt 174.0 lb

## 2024-03-31 DIAGNOSIS — R739 Hyperglycemia, unspecified: Secondary | ICD-10-CM | POA: Diagnosis not present

## 2024-03-31 DIAGNOSIS — Z0000 Encounter for general adult medical examination without abnormal findings: Secondary | ICD-10-CM | POA: Diagnosis not present

## 2024-03-31 DIAGNOSIS — E7800 Pure hypercholesterolemia, unspecified: Secondary | ICD-10-CM | POA: Diagnosis not present

## 2024-03-31 DIAGNOSIS — Z125 Encounter for screening for malignant neoplasm of prostate: Secondary | ICD-10-CM

## 2024-03-31 DIAGNOSIS — E538 Deficiency of other specified B group vitamins: Secondary | ICD-10-CM

## 2024-03-31 DIAGNOSIS — J439 Emphysema, unspecified: Secondary | ICD-10-CM | POA: Diagnosis not present

## 2024-03-31 DIAGNOSIS — F17200 Nicotine dependence, unspecified, uncomplicated: Secondary | ICD-10-CM

## 2024-03-31 DIAGNOSIS — Z0001 Encounter for general adult medical examination with abnormal findings: Secondary | ICD-10-CM

## 2024-03-31 LAB — BASIC METABOLIC PANEL WITH GFR
BUN: 14 mg/dL (ref 6–23)
CO2: 29 meq/L (ref 19–32)
Calcium: 8.8 mg/dL (ref 8.4–10.5)
Chloride: 103 meq/L (ref 96–112)
Creatinine, Ser: 0.7 mg/dL (ref 0.40–1.50)
GFR: 103.39 mL/min (ref >60.00)
Glucose, Bld: 85 mg/dL (ref 70–99)
Potassium: 4.3 meq/L (ref 3.5–5.1)
Sodium: 139 meq/L (ref 135–145)

## 2024-03-31 LAB — URINALYSIS, ROUTINE W REFLEX MICROSCOPIC
Bilirubin Urine: NEGATIVE
Ketones, ur: NEGATIVE
Leukocytes,Ua: NEGATIVE
Nitrite: NEGATIVE
Specific Gravity, Urine: 1.02 (ref 1.000–1.030)
Total Protein, Urine: NEGATIVE
Urine Glucose: NEGATIVE
Urobilinogen, UA: 0.2 (ref 0.0–1.0)
pH: 6 (ref 5.0–8.0)

## 2024-03-31 LAB — CBC WITH DIFFERENTIAL/PLATELET
Basophils Absolute: 0.1 K/uL (ref 0.0–0.1)
Basophils Relative: 1.3 % (ref 0.0–3.0)
Eosinophils Absolute: 0.2 K/uL (ref 0.0–0.7)
Eosinophils Relative: 3.6 % (ref 0.0–5.0)
HCT: 43.1 % (ref 39.0–52.0)
Hemoglobin: 14.8 g/dL (ref 13.0–17.0)
Lymphocytes Relative: 30.8 % (ref 12.0–46.0)
Lymphs Abs: 1.9 K/uL (ref 0.7–4.0)
MCHC: 34.2 g/dL (ref 30.0–36.0)
MCV: 96.8 fl (ref 78.0–100.0)
Monocytes Absolute: 0.8 K/uL (ref 0.1–1.0)
Monocytes Relative: 12.9 % — ABNORMAL HIGH (ref 3.0–12.0)
Neutro Abs: 3.2 K/uL (ref 1.4–7.7)
Neutrophils Relative %: 51.4 % (ref 43.0–77.0)
Platelets: 300 K/uL (ref 150.0–400.0)
RBC: 4.46 Mil/uL (ref 4.22–5.81)
RDW: 13 % (ref 11.5–15.5)
WBC: 6.3 K/uL (ref 4.0–10.5)

## 2024-03-31 LAB — LIPID PANEL
Cholesterol: 199 mg/dL (ref 0–200)
HDL: 68.5 mg/dL (ref >39.00)
LDL Cholesterol: 101 mg/dL — ABNORMAL HIGH (ref 0–99)
NonHDL: 130.44
Total CHOL/HDL Ratio: 3
Triglycerides: 145 mg/dL (ref 0.0–149.0)
VLDL: 29 mg/dL (ref 0.0–40.0)

## 2024-03-31 LAB — HEMOGLOBIN A1C: Hgb A1c MFr Bld: 6 % (ref 4.6–6.5)

## 2024-03-31 LAB — HEPATIC FUNCTION PANEL
ALT: 14 U/L (ref 0–53)
AST: 15 U/L (ref 0–37)
Albumin: 4.4 g/dL (ref 3.5–5.2)
Alkaline Phosphatase: 75 U/L (ref 39–117)
Bilirubin, Direct: 0.1 mg/dL (ref 0.0–0.3)
Total Bilirubin: 0.5 mg/dL (ref 0.2–1.2)
Total Protein: 6.8 g/dL (ref 6.0–8.3)

## 2024-03-31 LAB — PSA: PSA: 0.55 ng/mL (ref 0.10–4.00)

## 2024-03-31 LAB — TSH: TSH: 1.29 u[IU]/mL (ref 0.35–5.50)

## 2024-03-31 MED ORDER — ALBUTEROL SULFATE HFA 108 (90 BASE) MCG/ACT IN AERS
2.0000 | INHALATION_SPRAY | Freq: Four times a day (QID) | RESPIRATORY_TRACT | 5 refills | Status: AC | PRN
  Filled 2024-03-31: qty 8.5, 25d supply, fill #0
  Filled 2024-04-14: qty 6.7, 25d supply, fill #0

## 2024-03-31 NOTE — Assessment & Plan Note (Signed)
 Quit now x 2 yrs, cont LDCT screening

## 2024-03-31 NOTE — Patient Instructions (Signed)
 Please take all new medication as prescribed - the albuterol hfa inhaler as needed  Please continue all other medications as before, and refills have been done if requested.  Please have the pharmacy call with any other refills you may need.  Please continue your efforts at being more active, low cholesterol diet, and weight control.  You are otherwise up to date with prevention measures today.  Please keep your appointments with your specialists as you may have planned  You will be contacted regarding the referral for: PFTs - pulmonary function tests  Please go to the LAB at the blood drawing area for the tests to be done  You will be contacted by phone if any changes need to be made immediately.  Otherwise, you will receive a letter about your results with an explanation, but please check with MyChart first.  Please make an Appointment to return for your 1 year visit, or sooner if needed, with Lab testing by Appointment as well, to be done about 3-5 days before at the FIRST FLOOR Lab (so this is for TWO appointments - please see the scheduling desk as you leave)

## 2024-03-31 NOTE — Progress Notes (Signed)
 Patient ID: Keith Armstrong, male   DOB: Jan 24, 1966, 58 y.o.   MRN: [id number]         Chief Complaint:: wellness exam and copd, recent fall with bilateral wrist fx, smoker, hyperglycemia, hld, low b12       HPI:  Keith Armstrong is a 58 y.o. male here for wellness exams, no tobacco now x 2 yrs. , declines all immunizations for now, o/w up to date                Also did have bilateral wrist fx with fall with less active and sedentary .  On skyrizi for psoriasis per dermatology.  Did have unfortunate fall with walking the dogs backwards with unfortunate bilateral wrist fx s/p surgury and still splints today.  Does still walk the dogs now up to 1 hr per day in the past wk, trying to become more active.  Did have aug 2025 LDCT with finding of mild emphysema, and is concerned and asks for further testing.  Pt denies chest pain, wheezing, orthopnea, PND, increased LE swelling, palpitations, dizziness or syncope, but has had mild sob doe in past few months with scant prod cough in the AM.       Wt Readings from Last 3 Encounters:  03/31/24 174 lb (78.9 kg)  12/12/21 140 lb (63.5 kg)  12/25/20 140 lb (63.5 kg)   BP Readings from Last 3 Encounters:  03/31/24 132/68  12/12/21 110/81  12/25/20 110/68   Immunization History  Administered Date(s) Administered   Hepatitis A, Adult 12/14/2013   Hepatitis B, ADULT 12/14/2013, 01/13/2014, 06/15/2014   Influenza,inj,Quad PF,6+ Mos 01/13/2014   PFIZER(Purple Top)SARS-COV-2 Vaccination 06/21/2019, 07/12/2019   Health Maintenance Due  Topic Date Due   DTaP/Tdap/Td (1 - Tdap) Never done   Pneumococcal Vaccine: 50+ Years (1 of 2 - PCV) Never done   Zoster Vaccines- Shingrix (1 of 2) Never done   Influenza Vaccine  12/19/2023   COVID-19 Vaccine (3 - 2025-26 season) 01/19/2024      Past Medical History:  Diagnosis Date   Allergy 1995   Bee Venom   B12 deficiency 12/13/2020   GERD (gastroesophageal reflux disease)    Hepatitis C    non reactive    HLD (hyperlipidemia) 05/29/2018   Laryngopharyngeal reflux (LPR)    Psoriasis    STD (sexually transmitted disease)    Tinnitus    Past Surgical History:  Procedure Laterality Date   COLONOSCOPY     INGUINAL HERNIA REPAIR Bilateral    and distended testicle   JOINT REPLACEMENT  2020   Big toe joint on left foot   TRIGGER FINGER RELEASE Left 07/30/2018   Procedure: RELEASE TRIGGER FINGER/A-1 PULLEY;  Surgeon: Murrell Kuba, MD;  Location: [facility];  Service: Orthopedics;  Laterality: Left;    reports that he quit smoking about 2 years ago. His smoking use included cigarettes. He has a 29 pack-year smoking history. He has never used smokeless tobacco. He reports current alcohol use of about 12.0 standard drinks of alcohol per week. He reports that he does not currently use drugs after having used the following drugs: Cocaine and Marijuana. family history includes Cancer in his brother; Cancer - Lung in his mother; Diabetes in his maternal uncle; Heart attack in his maternal grandmother; Heart disease in his maternal grandmother; Hypertension in his mother; Kidney failure in his maternal uncle; Lung cancer in his mother; Other in his father. Allergies  Allergen Reactions   Bee  Venom Anaphylaxis   Current Outpatient Medications on File Prior to Visit  Medication Sig Dispense Refill   cyclobenzaprine  (FLEXERIL ) 10 MG tablet Take 1 tablet (10 mg total) by mouth 3 (three) times daily as needed for muscle spasms. 30 tablet 0   diclofenac  (VOLTAREN ) 75 MG EC tablet Take 1 tablet (75 mg total) by mouth 2 (two) times daily as needed. Do not start taking until finished with steroid pack 60 tablet 2   EPINEPHrine  0.3 mg/0.3 mL IJ SOAJ injection Inject 0.3 mg into the muscle as needed for anaphylaxis. 2 each 1   No current facility-administered medications on file prior to visit.        ROS:  All others reviewed and negative.  Objective        PE:  BP 132/68   Pulse 90   Temp  (!) 95 F (35 C) (Temporal)   Ht 5' 10 (1.778 m)   Wt 174 lb (78.9 kg)   SpO2 95%   BMI 24.97 kg/m                 Constitutional: Pt appears in NAD               HENT: Head: NCAT.                Right Ear: External ear normal.                 Left Ear: External ear normal.                Eyes: . Pupils are equal, round, and reactive to light. Conjunctivae and EOM are normal               Nose: without d/c or deformity               Neck: Neck supple. Gross normal ROM               Cardiovascular: Normal rate and regular rhythm.                 Pulmonary/Chest: Effort normal and breath sounds without rales or wheezing.                Abd:  Soft, NT, ND, + BS, no organomegaly               Neurological: Pt is alert. At baseline orientation, motor grossly intact               Skin: Skin is warm. No rashes, no other new lesions, LE edema - none               Psychiatric: Pt behavior is normal without agitation   Micro: none  Cardiac tracings I have personally interpreted today:  none  Pertinent Radiological findings (summarize): none   Lab Results  Component Value Date   WBC 6.3 03/31/2024   HGB 14.8 03/31/2024   HCT 43.1 03/31/2024   PLT 300.0 03/31/2024   GLUCOSE 85 03/31/2024   CHOL 199 03/31/2024   TRIG 145.0 03/31/2024   HDL 68.50 03/31/2024   LDLCALC 101 (H) 03/31/2024   ALT 14 03/31/2024   AST 15 03/31/2024   NA 139 03/31/2024   K 4.3 03/31/2024   CL 103 03/31/2024   CREATININE 0.70 03/31/2024   BUN 14 03/31/2024   CO2 29 03/31/2024   TSH 1.29 03/31/2024   PSA 0.55 03/31/2024   INR 1.0 03/26/2016   HGBA1C 6.0 03/31/2024  Assessment/Plan:  Keith Armstrong is a 58 y.o. White or Caucasian [1] male with  has a past medical history of Allergy (1995), B12 deficiency (12/13/2020), GERD (gastroesophageal reflux disease), Hepatitis C, HLD (hyperlipidemia) (05/29/2018), Laryngopharyngeal reflux (LPR), Psoriasis, STD (sexually transmitted disease), and  Tinnitus.  Smoker Quit now x 2 yrs, cont LDCT screening  Encounter for well adult exam with abnormal findings Age and sex appropriate education and counseling updated with regular exercise and diet Referrals for preventative services - none needed Immunizations addressed - declines all for now Smoking counseling  - none needed Evidence for depression or other mood disorder - none significant Most recent labs reviewed. I have personally reviewed and have noted: 1) the patient's medical and social history 2) The patient's current medications and supplements 3) The patient's height, weight, and BMI have been recorded in the chart   Hyperglycemia Lab Results  Component Value Date   HGBA1C 6.0 03/31/2024   Stable, pt to continue current medical treatment  - diet,wt control   HLD (hyperlipidemia) Lab Results  Component Value Date   LDLCALC 101 (H) 03/31/2024   uncontrolled, pt for lower chol diet, declines statin   Emphysema lung (HCC) Noted likely mild on LDCT recently, now for PFTs, and trial albuterol hfa prn  B12 deficiency Lab Results  Component Value Date   VITAMINB12 207 (L) 12/13/2020   Low, to start oral replacement - b12 1000 mcg qd  Followup: Return in about 1 year (around 03/31/2025).  Lynwood Rush, MD 04/03/2024 1:27 PM [facility]

## 2024-03-31 NOTE — Progress Notes (Signed)
 The test results show that your current treatment is OK, as the tests are stable.  Please continue the same plan.  There is no other need for change of treatment or further evaluation based on these results, at this time.  thanks

## 2024-04-03 ENCOUNTER — Encounter: Payer: Self-pay | Admitting: Internal Medicine

## 2024-04-03 NOTE — Assessment & Plan Note (Signed)
Age and sex appropriate education and counseling updated with regular exercise and diet Referrals for preventative services - none needed Immunizations addressed - declines all for now Smoking counseling  - none needed Evidence for depression or other mood disorder - none significant Most recent labs reviewed. I have personally reviewed and have noted: 1) the patient's medical and social history 2) The patient's current medications and supplements 3) The patient's height, weight, and BMI have been recorded in the chart

## 2024-04-03 NOTE — Assessment & Plan Note (Signed)
 Noted likely mild on LDCT recently, now for PFTs, and trial albuterol hfa prn

## 2024-04-03 NOTE — Assessment & Plan Note (Signed)
 Lab Results  Component Value Date   HGBA1C 6.0 03/31/2024   Stable, pt to continue current medical treatment  - diet,wt control

## 2024-04-03 NOTE — Assessment & Plan Note (Signed)
 Lab Results  Component Value Date   LDLCALC 101 (H) 03/31/2024   uncontrolled, pt for lower chol diet, declines statin

## 2024-04-03 NOTE — Assessment & Plan Note (Signed)
 Lab Results  Component Value Date   VITAMINB12 207 (L) 12/13/2020   Low, to start oral replacement - b12 1000 mcg qd

## 2024-04-13 ENCOUNTER — Other Ambulatory Visit (HOSPITAL_BASED_OUTPATIENT_CLINIC_OR_DEPARTMENT_OTHER): Payer: Self-pay

## 2024-04-14 ENCOUNTER — Other Ambulatory Visit (HOSPITAL_BASED_OUTPATIENT_CLINIC_OR_DEPARTMENT_OTHER): Payer: Self-pay

## 2024-04-21 ENCOUNTER — Encounter: Admitting: Internal Medicine
